# Patient Record
Sex: Female | Born: 1955
Health system: Southern US, Community
[De-identification: ages and names within clinical notes are randomized; demographics above are authoritative.]

## PROBLEM LIST (undated history)

## (undated) DIAGNOSIS — F329 Major depressive disorder, single episode, unspecified: Secondary | ICD-10-CM

## (undated) DIAGNOSIS — M545 Low back pain, unspecified: Secondary | ICD-10-CM

## (undated) DIAGNOSIS — I7090 Unspecified atherosclerosis: Secondary | ICD-10-CM

## (undated) DIAGNOSIS — K219 Gastro-esophageal reflux disease without esophagitis: Secondary | ICD-10-CM

## (undated) DIAGNOSIS — E785 Hyperlipidemia, unspecified: Secondary | ICD-10-CM

## (undated) DIAGNOSIS — G43909 Migraine, unspecified, not intractable, without status migrainosus: Secondary | ICD-10-CM

## (undated) DIAGNOSIS — M1990 Unspecified osteoarthritis, unspecified site: Secondary | ICD-10-CM

## (undated) DIAGNOSIS — G8929 Other chronic pain: Secondary | ICD-10-CM

## (undated) DIAGNOSIS — F32A Depression, unspecified: Secondary | ICD-10-CM

## (undated) DIAGNOSIS — G43009 Migraine without aura, not intractable, without status migrainosus: Secondary | ICD-10-CM

## (undated) HISTORY — DX: Migraine, unspecified, not intractable, without status migrainosus: G43.909

## (undated) HISTORY — DX: Hyperlipidemia, unspecified: E78.5

## (undated) HISTORY — DX: Other chronic pain: G89.29

## (undated) HISTORY — PX: ABDOMINAL HYSTERECTOMY: SHX81

## (undated) HISTORY — DX: Migraine without aura, not intractable, without status migrainosus: G43.009

## (undated) HISTORY — DX: Unspecified atherosclerosis: I70.90

## (undated) HISTORY — PX: TUBAL LIGATION: SHX77

## (undated) HISTORY — DX: Major depressive disorder, single episode, unspecified: F32.9

## (undated) HISTORY — DX: Low back pain: M54.5

## (undated) HISTORY — PX: OTHER SURGICAL HISTORY: SHX169

## (undated) HISTORY — PX: HEMORRHOID SURGERY: SHX153

## (undated) HISTORY — PX: NECK SURGERY: SHX720

## (undated) HISTORY — DX: Low back pain, unspecified: M54.50

## (undated) HISTORY — PX: REFRACTIVE SURGERY: SHX103

## (undated) HISTORY — DX: Depression, unspecified: F32.A

---

## 1991-03-07 HISTORY — PX: OTHER SURGICAL HISTORY: SHX169

## 1997-07-16 ENCOUNTER — Ambulatory Visit (HOSPITAL_COMMUNITY): Admission: RE | Admit: 1997-07-16 | Discharge: 1997-07-16 | Payer: Self-pay | Admitting: *Deleted

## 1998-07-12 ENCOUNTER — Encounter: Payer: Self-pay | Admitting: *Deleted

## 1998-07-12 ENCOUNTER — Ambulatory Visit (HOSPITAL_COMMUNITY): Admission: RE | Admit: 1998-07-12 | Discharge: 1998-07-12 | Payer: Self-pay | Admitting: *Deleted

## 2001-04-29 ENCOUNTER — Ambulatory Visit (HOSPITAL_COMMUNITY): Admission: RE | Admit: 2001-04-29 | Discharge: 2001-04-29 | Payer: Self-pay | Admitting: *Deleted

## 2001-04-29 ENCOUNTER — Encounter: Payer: Self-pay | Admitting: *Deleted

## 2001-10-04 ENCOUNTER — Encounter: Payer: Self-pay | Admitting: Otolaryngology

## 2001-10-04 ENCOUNTER — Ambulatory Visit (HOSPITAL_COMMUNITY): Admission: RE | Admit: 2001-10-04 | Discharge: 2001-10-04 | Payer: Self-pay | Admitting: Otolaryngology

## 2003-12-14 ENCOUNTER — Ambulatory Visit (HOSPITAL_COMMUNITY): Admission: RE | Admit: 2003-12-14 | Discharge: 2003-12-14 | Payer: Self-pay | Admitting: Neurology

## 2005-01-24 ENCOUNTER — Ambulatory Visit: Payer: Self-pay | Admitting: Cardiology

## 2010-03-21 ENCOUNTER — Emergency Department (HOSPITAL_COMMUNITY)
Admission: EM | Admit: 2010-03-21 | Discharge: 2010-03-21 | Payer: Self-pay | Source: Home / Self Care | Admitting: Emergency Medicine

## 2010-07-22 NOTE — Procedures (Signed)
VISUAL EVOKED POTENTIAL STUDY   PROCEDURE NUMBER:  [id number]   CLINICAL HISTORY:  The patient is a 55 year old with dizziness and migraine  headaches.  She was tested with a 12 x 16 check pattern while wearing  glasses.  She complained of neck pain during the test and was quite tense.   PROCEDURE:  The study was carried out using a 12 x 16 grid pattern  oscillating at 1.9 times per second.  Filters ranged between 1 and 100 Hz.  A hundred stimuli were averaged in duplicate to provide the final response.  All latencies were expressed in milliseconds.   DESCRIPTION OF FINDINGS:  Stimulation of the left eye produced well-defined  waveforms with excellent interline correlation.  Latencies were as follows:  N1: 73.28, P1: 100.63, N2: 132.87.   Similarly, stimulation of the right eye produced well-defined waveforms with  excellent interline correlation.  Latencies were as follows:  N1: 77.18,  P1:  100.63, N2: 131.90.   IMPRESSION:  These pattern reversal visual evoked responses are within  normal limits and show no evidence of conduction abnormality in the anterior  visual pathways bilaterally.      JXB:JYNW  D:  12/15/2003 12:03:22  T:  12/15/2003 14:38:38  Job #:  [id number]

## 2011-06-19 ENCOUNTER — Ambulatory Visit (INDEPENDENT_AMBULATORY_CARE_PROVIDER_SITE_OTHER): Payer: Medicare Other | Admitting: Family Medicine

## 2011-06-19 ENCOUNTER — Encounter: Payer: Self-pay | Admitting: Family Medicine

## 2011-06-19 ENCOUNTER — Other Ambulatory Visit: Payer: Self-pay | Admitting: Family Medicine

## 2011-06-19 VITALS — BP 104/74 | HR 91 | Resp 16 | Ht 65.75 in | Wt 163.4 lb

## 2011-06-19 DIAGNOSIS — F329 Major depressive disorder, single episode, unspecified: Secondary | ICD-10-CM

## 2011-06-19 DIAGNOSIS — K5900 Constipation, unspecified: Secondary | ICD-10-CM

## 2011-06-19 DIAGNOSIS — E785 Hyperlipidemia, unspecified: Secondary | ICD-10-CM | POA: Insufficient documentation

## 2011-06-19 DIAGNOSIS — G4700 Insomnia, unspecified: Secondary | ICD-10-CM

## 2011-06-19 DIAGNOSIS — F5104 Psychophysiologic insomnia: Secondary | ICD-10-CM

## 2011-06-19 DIAGNOSIS — M7918 Myalgia, other site: Secondary | ICD-10-CM

## 2011-06-19 DIAGNOSIS — F3289 Other specified depressive episodes: Secondary | ICD-10-CM

## 2011-06-19 DIAGNOSIS — G43909 Migraine, unspecified, not intractable, without status migrainosus: Secondary | ICD-10-CM

## 2011-06-19 DIAGNOSIS — IMO0001 Reserved for inherently not codable concepts without codable children: Secondary | ICD-10-CM

## 2011-06-19 DIAGNOSIS — K5909 Other constipation: Secondary | ICD-10-CM

## 2011-06-19 DIAGNOSIS — M791 Myalgia, unspecified site: Secondary | ICD-10-CM

## 2011-06-19 DIAGNOSIS — Z13228 Encounter for screening for other metabolic disorders: Secondary | ICD-10-CM

## 2011-06-19 DIAGNOSIS — F32A Depression, unspecified: Secondary | ICD-10-CM | POA: Insufficient documentation

## 2011-06-19 DIAGNOSIS — Z1321 Encounter for screening for nutritional disorder: Secondary | ICD-10-CM

## 2011-06-19 DIAGNOSIS — Z130 Encounter for screening for diseases of the blood and blood-forming organs and certain disorders involving the immune mechanism: Secondary | ICD-10-CM

## 2011-06-19 DIAGNOSIS — Z79899 Other long term (current) drug therapy: Secondary | ICD-10-CM | POA: Insufficient documentation

## 2011-06-19 MED ORDER — PROMETHAZINE HCL 25 MG PO TABS: 25.0000 mg | ORAL_TABLET | Freq: Four times a day (QID) | ORAL | Status: DC | PRN

## 2011-06-19 MED ORDER — LUBIPROSTONE 24 MCG PO CAPS: 24.0000 ug | ORAL_CAPSULE | Freq: Two times a day (BID) | ORAL | Status: AC

## 2011-06-19 NOTE — Patient Instructions (Addendum)
I will get your medical records Continue your current meds Get your labs done we will call with results F/U 2 months

## 2011-06-20 DIAGNOSIS — M7918 Myalgia, other site: Secondary | ICD-10-CM | POA: Insufficient documentation

## 2011-06-20 DIAGNOSIS — K5909 Other constipation: Secondary | ICD-10-CM | POA: Insufficient documentation

## 2011-06-20 DIAGNOSIS — F5104 Psychophysiologic insomnia: Secondary | ICD-10-CM | POA: Insufficient documentation

## 2011-06-20 LAB — COMPREHENSIVE METABOLIC PANEL
ALT: 11 U/L (ref 0–35)
Albumin: 4.5 g/dL (ref 3.5–5.2)
CO2: 24 mEq/L (ref 19–32)
Calcium: 9.8 mg/dL (ref 8.4–10.5)
Chloride: 107 mEq/L (ref 96–112)
Creat: 0.85 mg/dL (ref 0.50–1.10)
Potassium: 4.1 mEq/L (ref 3.5–5.3)

## 2011-06-20 LAB — CBC WITH DIFFERENTIAL/PLATELET
Hemoglobin: 12.3 g/dL (ref 12.0–15.0)
Lymphocytes Relative: 25 % (ref 12–46)
Lymphs Abs: 1.1 10*3/uL (ref 0.7–4.0)
MCH: 28.5 pg (ref 26.0–34.0)
Monocytes Relative: 6 % (ref 3–12)
Neutro Abs: 3 10*3/uL (ref 1.7–7.7)
Neutrophils Relative %: 64 % (ref 43–77)
Platelets: 216 10*3/uL (ref 150–400)
RBC: 4.32 MIL/uL (ref 3.87–5.11)
WBC: 4.6 10*3/uL (ref 4.0–10.5)

## 2011-06-20 LAB — LIPID PANEL
Cholesterol: 176 mg/dL (ref 0–200)
Total CHOL/HDL Ratio: 4.4 Ratio

## 2011-06-20 LAB — CK TOTAL AND CKMB (NOT AT ARMC)
CK, MB: 1 ng/mL (ref 0.3–4.0)
Total CK: 53 U/L (ref 7–177)

## 2011-06-20 NOTE — Assessment & Plan Note (Signed)
Due for his metabolic panel as well as complete CBC

## 2011-06-20 NOTE — Assessment & Plan Note (Signed)
Check fasting lipid panel has history of very elevated triglycerides

## 2011-06-20 NOTE — Assessment & Plan Note (Signed)
Given trial of Amitiza

## 2011-06-20 NOTE — Progress Notes (Signed)
  Subjective:    Patient ID: Savannah Rocha, female    DOB: [birth year], 56 y.o.   MRN: [id number]  HPI  Patient here to establish care. Previous primary care provider Dr. Sherryll Burger in [facility] Neurologist Dr. Anne Hahn GYN Dr. Mora Appl  Medications and history reviewed She is overdue for labs including lipid panel  Migraine- chronic history of migraine headaches. She's followed by neurology Dr. Anne Hahn. She's currently on Topamax and uses Imitrex as needed she also uses Phenergan and hydrocodone for severe migraines.  Constipation- history of chronic constipation she currently has these medications to have a bowel movement every time. She uses Dulcolax over-the-counter. She had a colonoscopy on June 2006 at that time had internal hemorrhoids otherwise normal. She was to have a repeat exam in 2011 however has not had this. She occasionally uses her daughters medication to have BM but does not know the name. Miralax/PEG has not helped  Chronic insomnia- uses imipramine but this does not help a lot  Paresthesia in feet/hands- has tried neurontin which has not helped, has history of neck surgery for benign tumor which has left her weak on the right side   Depression- on Zoloft and Xanax, which she does not use on regular basis, chronic meds  Review of Systems - per above  GEN- + fatigue, fever, weight loss,weakness, recent illness HEENT- denies eye drainage, change in vision, nasal discharge, CVS- denies chest pain, palpitations RESP- denies SOB, cough, wheeze ABD- denies N/V, change in stools, abd pain GU- denies dysuria, hematuria, dribbling, incontinence MSK- + joint pain, +muscle aches, injury Neuro- +headache, dizziness, syncope, seizure activity       Objective:   Physical Exam GEN- NAD, alert and oriented x3 HEENT- PERRL, EOMI, non injected sclera, pink conjunctiva, MMM, oropharynx clear Neck- Supple, no thyromegaly CVS- RRR, no murmur RESP-CTAB ABD-NABS,soft,  NT,ND EXT- No edema Pulses- Radial, DP- 2+ Feet- no open lesions, decreased sensation on soles of feet Psych- not depressed appearing or anxious, normal speech and affect      Assessment & Plan:

## 2011-06-20 NOTE — Assessment & Plan Note (Signed)
Will obtain records from neurology. Patient to continue current medication

## 2011-06-20 NOTE — Assessment & Plan Note (Signed)
She gets muscle aches and tingling in lower ext, will check LFT and CK as on statin therapy

## 2011-06-20 NOTE — Assessment & Plan Note (Signed)
Continue Zoloft and Xanax

## 2011-07-03 ENCOUNTER — Telehealth: Payer: Self-pay | Admitting: Family Medicine

## 2011-07-03 MED ORDER — ALPRAZOLAM 0.5 MG PO TABS: 0.5000 mg | ORAL_TABLET | Freq: Two times a day (BID) | ORAL | Status: DC

## 2011-07-03 NOTE — Telephone Encounter (Signed)
Will check into prior auth for amitiza and do you want to refill xanax?

## 2011-07-03 NOTE — Telephone Encounter (Signed)
Please get the prior authorization for the Amitiza. Xanax refilled

## 2011-07-07 NOTE — Telephone Encounter (Signed)
Completed.

## 2011-07-10 ENCOUNTER — Telehealth: Payer: Self-pay | Admitting: Family Medicine

## 2011-07-10 NOTE — Telephone Encounter (Signed)
Left message that med was approved

## 2011-07-25 ENCOUNTER — Ambulatory Visit (INDEPENDENT_AMBULATORY_CARE_PROVIDER_SITE_OTHER): Payer: Medicare Other | Admitting: Family Medicine

## 2011-07-25 ENCOUNTER — Encounter: Payer: Self-pay | Admitting: Family Medicine

## 2011-07-25 VITALS — BP 92/60 | HR 86 | Resp 18 | Ht 65.75 in | Wt 165.0 lb

## 2011-07-25 DIAGNOSIS — G43909 Migraine, unspecified, not intractable, without status migrainosus: Secondary | ICD-10-CM

## 2011-07-25 DIAGNOSIS — F32A Depression, unspecified: Secondary | ICD-10-CM

## 2011-07-25 DIAGNOSIS — E785 Hyperlipidemia, unspecified: Secondary | ICD-10-CM

## 2011-07-25 DIAGNOSIS — K5909 Other constipation: Secondary | ICD-10-CM

## 2011-07-25 DIAGNOSIS — F329 Major depressive disorder, single episode, unspecified: Secondary | ICD-10-CM

## 2011-07-25 DIAGNOSIS — Z79899 Other long term (current) drug therapy: Secondary | ICD-10-CM

## 2011-07-25 DIAGNOSIS — IMO0001 Reserved for inherently not codable concepts without codable children: Secondary | ICD-10-CM

## 2011-07-25 DIAGNOSIS — K5900 Constipation, unspecified: Secondary | ICD-10-CM

## 2011-07-25 DIAGNOSIS — M797 Fibromyalgia: Secondary | ICD-10-CM | POA: Insufficient documentation

## 2011-07-25 DIAGNOSIS — F3289 Other specified depressive episodes: Secondary | ICD-10-CM

## 2011-07-25 MED ORDER — DULOXETINE HCL 20 MG PO CPEP: 20.0000 mg | ORAL_CAPSULE | Freq: Every day | ORAL | Status: DC

## 2011-07-25 MED ORDER — LUBIPROSTONE 24 MCG PO CAPS: 24.0000 ug | ORAL_CAPSULE | Freq: Two times a day (BID) | ORAL | Status: AC

## 2011-07-25 NOTE — Assessment & Plan Note (Signed)
Lipids at goal, 4 week holiday to see if this helps muscle aches

## 2011-07-25 NOTE — Progress Notes (Signed)
  Subjective:    Patient ID: Savannah Rocha, female    DOB: October 24, 1955, 56 y.o.   MRN: [id number]  HPI Pt presents with muscle aches all over in back arms and legs, she also has had some pain in her nuckles and hands, and had swelling in her hands and feet last week, now resolved. She feels very fatigued,her mother in law died 2 weeks ago and this has been very hard on the family.She thinks her mood is okay and she would like to come off some meds, she stopped her imipramine. Medications reviewed  Review of Systems GEN- + fatigue, fever, weight loss,weakness, recent illness HEENT- denies eye drainage, change in vision, nasal discharge, CVS- denies chest pain, palpitations RESP- denies SOB, cough, wheeze ABD- denies N/V, change in stools, abd pain GU- denies dysuria, hematuria, dribbling, incontinence MSK- + joint pain, +muscle aches, injury Neuro- + headache, dizziness, syncope, seizure activity        Objective:   Physical Exam GEN- NAD, alert and oriented x3 HEENT- PERRL, EOMI, non injected sclera, pink conjunctiva, MMM, oropharynx clear Neck- Supple, no thryomegaly CVS- RRR, no murmur RESP-CTAB EXT- No edema Pulses- Radial, DP- 2+ Psych- normal speech and affect MSK- no joint swelling, TTP over trigger points in back and legs      Assessment & Plan:

## 2011-07-25 NOTE — Assessment & Plan Note (Signed)
Improved with Amitiza

## 2011-07-25 NOTE — Assessment & Plan Note (Signed)
Doing well despite recent death in family, continue xanax, plan to titrate off zoloft and start cymbalta

## 2011-07-25 NOTE — Assessment & Plan Note (Signed)
Unchanged, continue current meds 

## 2011-07-25 NOTE — Assessment & Plan Note (Signed)
Her symptoms are more consistent with fibromyalgia, she has had negative bloodwork and conduction studies in the past for her neuropathy symptoms as well. Will try to put her on Cymbalta to cover both her chronic depression and pain.  I will give her 4 weeks off her statin to see if this helps

## 2011-07-25 NOTE — Patient Instructions (Signed)
For your zoloft, take 1/2 tablet a day for the next week. Then start the cymbalta once a day Do not take the pravastatin for 4 weeks - to see if this helps your muscle aches Get the thyroid levels drawn Try V8 juice/ or Gaterade for the cramps F/U 2 months

## 2011-07-26 ENCOUNTER — Other Ambulatory Visit: Payer: Self-pay | Admitting: Family Medicine

## 2011-07-26 LAB — TSH: TSH: 0.949 u[IU]/mL (ref 0.350–4.500)

## 2011-08-01 ENCOUNTER — Telehealth: Payer: Self-pay | Admitting: Family Medicine

## 2011-08-01 MED ORDER — MILNACIPRAN HCL 25 MG PO TABS: ORAL_TABLET | ORAL | Status: DC

## 2011-08-01 NOTE — Telephone Encounter (Signed)
Her insurance not to Cymbalta clean. It will however takes the [facility]  which is also used for depression and fibromyalgia. Will start her on 12.5 mg daily for 1 day, then she will increase to 12.5 mg twice a day for 2 days, then increase to 25mg  BID for 3 days, then increase to 50mg  BID.  25 mg tablets sent over

## 2011-08-02 ENCOUNTER — Telehealth: Payer: Self-pay | Admitting: Family Medicine

## 2011-08-02 NOTE — Telephone Encounter (Signed)
I spoke with pt, she has stopped her SSRI she wants to hold on trying anything new at this time. S

## 2011-08-02 NOTE — Telephone Encounter (Signed)
I called pt again and LVM, please let her know, her insurance would not cover Cymbalta.Instead they will cover a medication called Savella which is used for both fibromyalgia and Depression. Side effects mostly are headache and nausea if they happen. Below is how she is suppose to take the medication

## 2011-08-08 NOTE — Telephone Encounter (Signed)
Doesn't want to take any meds that could potentially cause headaches because she already has bad migraines

## 2011-08-14 ENCOUNTER — Ambulatory Visit: Payer: PRIVATE HEALTH INSURANCE | Admitting: Family Medicine

## 2011-08-24 ENCOUNTER — Encounter (HOSPITAL_COMMUNITY): Payer: Self-pay | Admitting: Emergency Medicine

## 2011-08-24 ENCOUNTER — Emergency Department (HOSPITAL_COMMUNITY)
Admission: EM | Admit: 2011-08-24 | Discharge: 2011-08-25 | Disposition: A | Discharge status: Home / Self Care | Payer: Medicare Other | Attending: Emergency Medicine | Admitting: Emergency Medicine

## 2011-08-24 DIAGNOSIS — G43909 Migraine, unspecified, not intractable, without status migrainosus: Secondary | ICD-10-CM | POA: Insufficient documentation

## 2011-08-24 DIAGNOSIS — M545 Low back pain, unspecified: Secondary | ICD-10-CM | POA: Insufficient documentation

## 2011-08-24 DIAGNOSIS — Z836 Family history of other diseases of the respiratory system: Secondary | ICD-10-CM | POA: Insufficient documentation

## 2011-08-24 DIAGNOSIS — Z90710 Acquired absence of both cervix and uterus: Secondary | ICD-10-CM | POA: Insufficient documentation

## 2011-08-24 DIAGNOSIS — Z801 Family history of malignant neoplasm of trachea, bronchus and lung: Secondary | ICD-10-CM | POA: Insufficient documentation

## 2011-08-24 DIAGNOSIS — F3289 Other specified depressive episodes: Secondary | ICD-10-CM | POA: Insufficient documentation

## 2011-08-24 DIAGNOSIS — G8929 Other chronic pain: Secondary | ICD-10-CM | POA: Insufficient documentation

## 2011-08-24 DIAGNOSIS — E785 Hyperlipidemia, unspecified: Secondary | ICD-10-CM | POA: Insufficient documentation

## 2011-08-24 DIAGNOSIS — F329 Major depressive disorder, single episode, unspecified: Secondary | ICD-10-CM | POA: Insufficient documentation

## 2011-08-24 DIAGNOSIS — Z7982 Long term (current) use of aspirin: Secondary | ICD-10-CM | POA: Insufficient documentation

## 2011-08-24 DIAGNOSIS — M549 Dorsalgia, unspecified: Secondary | ICD-10-CM

## 2011-08-24 DIAGNOSIS — Z8249 Family history of ischemic heart disease and other diseases of the circulatory system: Secondary | ICD-10-CM | POA: Insufficient documentation

## 2011-08-24 LAB — URINALYSIS, ROUTINE W REFLEX MICROSCOPIC
Bilirubin Urine: NEGATIVE
Hgb urine dipstick: NEGATIVE
Protein, ur: NEGATIVE mg/dL
Urobilinogen, UA: 0.2 mg/dL (ref 0.0–1.0)

## 2011-08-24 NOTE — ED Notes (Signed)
C/o lower back pain onset yesterday after mowing lawn with riding mower; states then went shopping and pain started shortly thereafter. Reports hx of chronic back pain with surgical repair of ruptured discs in lower back.  States has been taking flexeril and hydrocodone with little relief.  Pt is in obvious pain, tearful, and states that sitting upright is the most comfortable for her.

## 2011-08-24 NOTE — ED Provider Notes (Signed)
History  Scribed for [employer]. Colon Branch, MD, the patient was seen in room APA08/APA08. This chart was scribed by Candelaria Stagers. The patient's care started at 11:35 PM   CSN: [id number]  Arrival date & time 08/24/11  2235   First MD Initiated Contact with Patient 08/24/11 2324      Chief Complaint  Patient presents with  . Back Pain    The history is provided by the patient.   Savannah Rocha is a 56 y.o. female who presents to the Emergency Department complaining of lower back pain that she reports started yesterday after mowing her lawn.  She states that the pain has gone up into upper back.  She states that the pain hurts worse when standing up.  She is having difficulty walking.  She has used a walker today with some relief.  She has taken a muscle relaxer, hydrocodone, ice, heating pad, and analgesic creams.  She has h/o back surgeries.    PCP Dr. Jeanice Lim Past Medical History  Diagnosis Date  . Migraines   . Chronic lower back pain   . Depression   . Hyperlipidemia     Past Surgical History  Procedure Date  . Lower back    . Abdominal hysterectomy   . Hemorrhoid surgery   . Right knee 1993  . Neck surgery     benign tumor removed     Family History  Problem Relation Age of Onset  . Cancer Father     lung  . Depression Sister   . Hypertension Sister   . Hyperlipidemia Sister   . Depression Sister   . Heart disease Brother   . COPD Brother     History  Substance Use Topics  . Smoking status: Never Smoker   . Smokeless tobacco: Not on file  . Alcohol Use: No    OB History    Grav Para Term Preterm Abortions TAB SAB Ect Mult Living                  Review of Systems  Constitutional: Negative for fever.       Per HPI, otherwise negative  HENT: Negative for congestion.        Per HPI, otherwise negative  Eyes: Negative.  Negative for discharge and redness.  Respiratory: Negative for cough and shortness of breath.        Per HPI, otherwise negative    Cardiovascular: Negative for chest pain.       Per HPI, otherwise negative  Gastrointestinal: Negative for vomiting and abdominal pain.  Genitourinary: Negative.   Musculoskeletal: Negative for back pain.       Per HPI, otherwise negative  Skin: Negative.  Negative for rash.  Neurological: Negative for syncope, numbness and headaches.  Psychiatric/Behavioral:       No behavior change.    Allergies  Review of patient's allergies indicates no known allergies.  Home Medications   Current Outpatient Rx  Name Route Sig Dispense Refill  . ALPRAZOLAM 0.5 MG PO TABS Oral Take 1 tablet (0.5 mg total) by mouth 2 (two) times daily. 60 tablet 2  . ASPIRIN 325 MG PO TABS Oral Take 325 mg by mouth daily.    . CYCLOBENZAPRINE HCL 10 MG PO TABS Oral Take 10 mg by mouth 3 (three) times daily as needed.    Marland Kitchen GEMFIBROZIL 600 MG PO TABS Oral Take 600 mg by mouth 2 (two) times daily before a meal.    . HYDROCODONE-ACETAMINOPHEN 5-500  MG PO TABS Oral Take 1 tablet by mouth every 6 (six) hours as needed.    . LUBIPROSTONE 24 MCG PO CAPS Oral Take 1 capsule (24 mcg total) by mouth 2 (two) times daily with a meal. 60 capsule 3  . MILNACIPRAN HCL 25 MG PO TABS  Take 50mg  by mouth twice a day 120 tablet 3  . OMEPRAZOLE 20 MG PO CPDR Oral Take 20 mg by mouth daily.    . SERTRALINE HCL 100 MG PO TABS Oral Take 100 mg by mouth daily.    Marland Kitchen SIMVASTATIN 40 MG PO TABS Oral Take 40 mg by mouth every evening.    . SUMATRIPTAN SUCCINATE 100 MG PO TABS  TAKE 1 TABLET BY MOUTH AS NEEDED 20 tablet 2    Generic [id number]   100MG    N O T I C E   PRESCR ...  . TOPIRAMATE 100 MG PO TABS Oral Take 100 mg by mouth daily.      BP 109/74  Pulse 105  Temp 97.5 F (36.4 C) (Oral)  Resp 16  Ht 5\' 5"  (1.651 m)  Wt 160 lb (72.576 kg)  BMI 26.63 kg/m2  SpO2 99%  Physical Exam  Nursing note and vitals reviewed. Constitutional: She is oriented to person, place, and time. She appears well-developed and well-nourished. No  distress.       Awake, alert, nontoxic appearance with baseline speech.  HENT:  Head: Normocephalic and atraumatic.  Eyes: EOM are normal. Pupils are equal, round, and reactive to light. Right eye exhibits no discharge. Left eye exhibits no discharge.  Neck: Neck supple. No tracheal deviation present.  Cardiovascular: Normal rate and regular rhythm.   No murmur heard. Pulmonary/Chest: Effort normal and breath sounds normal. No respiratory distress. She has no wheezes. She has no rales. She exhibits no tenderness.  Abdominal: Soft. Bowel sounds are normal. She exhibits no distension and no mass. There is no tenderness. There is no rebound.  Musculoskeletal: Normal range of motion. She exhibits no edema.       Thoracic back: She exhibits no tenderness.       Lumbar back: She exhibits no tenderness.       Tenderness from the bilaterally paraspinal muscles from L2 down to the sacrum on both sides.  Spine is normal  Neurological: She is alert and oriented to person, place, and time. No sensory deficit.       Mental status baseline for patient.  Upper extremity motor strength and sensation intact and symmetric bilaterally.  Skin: Skin is warm and dry. No rash noted.  Psychiatric: She has a normal mood and affect. Her behavior is normal.    ED Course  Procedures   DIAGNOSTIC STUDIES: Oxygen Saturation is 99% on room air, normal by my interpretation.    COORDINATION OF CARE:   Results for orders placed during the hospital encounter of 08/24/11  URINALYSIS, ROUTINE W REFLEX MICROSCOPIC      Component Value Range   Color, Urine STRAW (*) YELLOW   APPearance CLEAR  CLEAR   Specific Gravity, Urine <1.005 (*) 1.005 - 1.030   pH 6.0  5.0 - 8.0   Glucose, UA NEGATIVE  NEGATIVE mg/dL   Hgb urine dipstick NEGATIVE  NEGATIVE   Bilirubin Urine NEGATIVE  NEGATIVE   Ketones, ur NEGATIVE  NEGATIVE mg/dL   Protein, ur NEGATIVE  NEGATIVE mg/dL   Urobilinogen, UA 0.2  0.0 - 1.0 mg/dL   Nitrite  NEGATIVE  NEGATIVE   Leukocytes,  UA NEGATIVE  NEGATIVE     Dg Lumbar Spine Complete  08/25/2011  *RADIOLOGY REPORT*  Clinical Data: Low back pain.  LUMBAR SPINE - COMPLETE 4+ VIEW  Comparison: None.  Findings: No evidence of acute fracture, spondylolysis, or spondylolisthesis.  Old lumbar spine fusions with pedicle screws and posterior fixation rods are seen from levels of L4-S1. No evidence of hardware failure or loosening.  Mild degenerative disc disease seen at L2-3.  No other significant bone abnormality identified.  Atherosclerotic calcification of the abdominal aorta noted.  IMPRESSION:  1.  No acute findings. 2.  Mild L2-3 degenerative disc disease. 3.  Old lumbar spine fusion from L4-S1.  Original Report Authenticated By: Danae Orleans, M.D.    MDM  Patient with a history of back surgeries who mowed the lawn  yesterday and as a result  developed lower back pain.given IV fluids, analgesics x2 , antiemetic, antiinflammatory with improvement in pain. Patient able to ambulate to the bathroom unassisted. Xrays do not show acute injury. Hardware from previous surgeries is intact. Dx testing d/w pt and family.  Questions answered.  Verb understanding, agreeable to d/c home with outpt f/u. Pt feels improved after observation and/or treatment in ED.Pt stable in ED with no significant deterioration in condition.The patient appears reasonably screened and/or stabilized for discharge and I doubt any other medical condition or other [facility] requiring further screening, evaluation, or treatment in the ED at this time prior to discharge.  I personally performed the services described in this documentation, which was scribed in my presence. The recorded information has been reviewed and considered.   MDM Reviewed: nursing note and vitals Interpretation: x-ray and labs           [facility]. Colon Branch, MD 08/25/11 [phone]

## 2011-08-24 NOTE — ED Notes (Signed)
Pt presents with back pain x2 days. States it has progressively gotten worse over the last 2 days. States she had 2 back surgerys in the past back in the 90s. Pt appears visibly in pain, Denies any specific injury to aggravate the issue. Pt is AAx4. States her pain 10/10

## 2011-08-25 ENCOUNTER — Emergency Department (HOSPITAL_COMMUNITY): Payer: Medicare Other

## 2011-08-25 MED ORDER — OXYCODONE-ACETAMINOPHEN 5-325 MG PO TABS: 1.0000 | ORAL_TABLET | ORAL | Status: DC | PRN

## 2011-08-25 MED ORDER — HYDROMORPHONE HCL PF 1 MG/ML IJ SOLN
1.0000 mg | Freq: Once | INTRAMUSCULAR | Status: AC
  Administered 2011-08-25: 1 mg via INTRAVENOUS
  Filled 2011-08-25: qty 1

## 2011-08-25 MED ORDER — ONDANSETRON HCL 4 MG/2ML IJ SOLN
4.0000 mg | Freq: Once | INTRAMUSCULAR | Status: AC
  Administered 2011-08-25: 4 mg via INTRAVENOUS
  Filled 2011-08-25: qty 2

## 2011-08-25 MED ORDER — ONDANSETRON HCL 4 MG PO TABS: 4.0000 mg | ORAL_TABLET | Freq: Four times a day (QID) | ORAL | Status: DC

## 2011-08-25 MED ORDER — KETOROLAC TROMETHAMINE 30 MG/ML IJ SOLN
30.0000 mg | Freq: Once | INTRAMUSCULAR | Status: AC
  Administered 2011-08-25: 30 mg via INTRAVENOUS
  Filled 2011-08-25: qty 1

## 2011-08-25 NOTE — ED Notes (Signed)
Returns from radiology; in no distress; pt is calm and appears comfortable.  States she feels better.

## 2011-08-25 NOTE — Discharge Instructions (Signed)
Your x-rays show that all of the hardware from your previous surgeries is intact. There was no new injury. Continue the comfort measures you have been using with heat, ice, rest. Use the stronger pain medicine as needed. Use the nausea medicine if needed. Followup with your doctor.    Back Pain, Adult Back pain is very common. The pain often gets better over time. The cause of back pain is usually not dangerous. Most people can learn to manage their back pain on their own.  HOME CARE   Stay active. Start with short walks on flat ground if you can. Try to walk farther each day.   Do not sit, drive, or stand in one place for more than 30 minutes. Do not stay in bed.   Do not avoid exercise or work. Activity can help your back heal faster.   Be careful when you bend or lift an object. Bend at your knees, keep the object close to you, and do not twist.   Sleep on a firm mattress. Lie on your side, and bend your knees. If you lie on your back, put a pillow under your knees.   Only take medicines as told by your doctor.   Put ice on the injured area.   Put ice in a plastic bag.   Place a towel between your skin and the bag.   Leave the ice on for 15 to 20 minutes, 3 to 4 times a day for the first 2 to 3 days. After that, you can switch between ice and heat packs.   Ask your doctor about back exercises or massage.   Avoid feeling anxious or stressed. Find good ways to deal with stress, such as exercise.  GET HELP RIGHT AWAY IF:   Your pain does not go away with rest or medicine.   Your pain does not go away in 1 week.   You have new problems.   You do not feel well.   The pain spreads into your legs.   You cannot control when you poop (bowel movement) or pee (urinate).   Your arms or legs feel weak or lose feeling (numbness).   You feel sick to your stomach (nauseous) or throw up (vomit).   You have belly (abdominal) pain.   You feel like you may pass out (faint).  MAKE  SURE YOU:   Understand these instructions.   Will watch your condition.   Will get help right away if you are not doing well or get worse.  Document Released: 08/09/2007 Document Revised: 02/09/2011 Document Reviewed: 07/11/2010 [facility] Patient Information 2012 [place], [place].

## 2011-08-25 NOTE — ED Notes (Signed)
Left in c/o family for transport home; in no distress; arouses easily to verbal stimuli; respirations even, non-labored; left in c/o family for transport home; instructions reviewed and f/u information provided; family and pt verbalize understanding. Escorted to waiting room via wc.

## 2011-08-26 ENCOUNTER — Emergency Department (HOSPITAL_COMMUNITY)
Admission: EM | Admit: 2011-08-26 | Discharge: 2011-08-26 | Disposition: A | Discharge status: Home / Self Care | Payer: Medicare Other | Attending: Emergency Medicine | Admitting: Emergency Medicine

## 2011-08-26 ENCOUNTER — Encounter (HOSPITAL_COMMUNITY): Payer: Self-pay | Admitting: Physical Medicine and Rehabilitation

## 2011-08-26 DIAGNOSIS — Z79899 Other long term (current) drug therapy: Secondary | ICD-10-CM | POA: Insufficient documentation

## 2011-08-26 DIAGNOSIS — M5380 Other specified dorsopathies, site unspecified: Secondary | ICD-10-CM | POA: Insufficient documentation

## 2011-08-26 DIAGNOSIS — G43909 Migraine, unspecified, not intractable, without status migrainosus: Secondary | ICD-10-CM | POA: Insufficient documentation

## 2011-08-26 DIAGNOSIS — M549 Dorsalgia, unspecified: Secondary | ICD-10-CM

## 2011-08-26 DIAGNOSIS — E785 Hyperlipidemia, unspecified: Secondary | ICD-10-CM | POA: Insufficient documentation

## 2011-08-26 DIAGNOSIS — M545 Low back pain, unspecified: Secondary | ICD-10-CM | POA: Insufficient documentation

## 2011-08-26 DIAGNOSIS — Z7982 Long term (current) use of aspirin: Secondary | ICD-10-CM | POA: Insufficient documentation

## 2011-08-26 DIAGNOSIS — M62838 Other muscle spasm: Secondary | ICD-10-CM

## 2011-08-26 MED ORDER — DIAZEPAM 5 MG PO TABS
10.0000 mg | ORAL_TABLET | Freq: Once | ORAL | Status: AC
  Administered 2011-08-26: 10 mg via ORAL
  Filled 2011-08-26: qty 2

## 2011-08-26 MED ORDER — DIAZEPAM 5 MG PO TABS: 5.0000 mg | ORAL_TABLET | Freq: Four times a day (QID) | ORAL | Status: AC | PRN

## 2011-08-26 MED ORDER — DIAZEPAM 5 MG/ML IJ SOLN: 10.0000 mg | Freq: Once | INTRAMUSCULAR | Status: DC

## 2011-08-26 NOTE — ED Notes (Signed)
Pt presents to department for evaluation of lower back pain. States she was mowing yard on Thursday, pain started later that evening. Pt is worried that she pulled something. States history of surgery to back. 8/10 pain upon arrival. Pt states pain increases with movement, having trouble walking and is unable to lay flat. Also states that both of her legs feel heavy. She is alert and oriented x4.

## 2011-08-26 NOTE — ED Notes (Addendum)
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 oxycodone that was given to her in the other hosp made her sick and was unable to take it. Pt has hx of back surgery with plate in place.

## 2011-08-26 NOTE — ED Provider Notes (Signed)
History     CSN: [id number]  Arrival date & time 08/26/11  1839   First MD Initiated Contact with Patient 08/26/11 2113      Chief Complaint  Patient presents with  . Back Pain   HPI  History provided by the patient. Patient is a 56 year old female with history of lumbar back surgery, hyperlipidemia, depression and migraines presents with complaints of low back pain and "muscle spasms". Patient states that she's been having increased and sharp severe low back pains for the past 3 days. Patient states that symptoms began with a general ache to her low back after sitting on the riding lawn mower to mow the lawn on Thursday. Patient states that the next morning she had episodes of severe pain that would limit her movements and stop her in her "tracks". Patient states she has been trying to lay around to rest and has used some over-the-counter pain medications without significant relief. Patient was seen for these complaints yesterday at any pain hospital and had x-rays performed. Patient was given additional prescriptions for medications to help with symptoms but states this has not been helping completely. She denies any other change in symptoms. Patient denies any radiation of pain. She denies any numbness or weakness to lower extremities. She denies any urinary or fecal incontinence, urinary retention or perineal numbness.     Past Medical History  Diagnosis Date  . Migraines   . Chronic lower back pain   . Depression   . Hyperlipidemia     Past Surgical History  Procedure Date  . Lower back    . Abdominal hysterectomy   . Hemorrhoid surgery   . Right knee 1993  . Neck surgery     benign tumor removed     Family History  Problem Relation Age of Onset  . Cancer Father     lung  . Depression Sister   . Hypertension Sister   . Hyperlipidemia Sister   . Depression Sister   . Heart disease Brother   . COPD Brother     History  Substance Use Topics  . Smoking status:  Never Smoker   . Smokeless tobacco: Not on file  . Alcohol Use: No    OB History    Grav Para Term Preterm Abortions TAB SAB Ect Mult Living                  Review of Systems  Constitutional: Negative for fever and chills.  HENT: Negative for neck pain.   Gastrointestinal: Negative for nausea and vomiting.  Genitourinary: Negative for dysuria, frequency, hematuria, flank pain, vaginal bleeding and vaginal discharge.  Musculoskeletal: Positive for back pain.  Skin: Negative for rash.    Allergies  Review of patient's allergies indicates no known allergies.  Home Medications   Current Outpatient Rx  Name Route Sig Dispense Refill  . ALPRAZOLAM 0.5 MG PO TABS Oral Take 0.5 mg by mouth 2 (two) times daily.    . ASPIRIN 325 MG PO TABS Oral Take 325 mg by mouth daily.    Marland Kitchen CALCIUM CARBONATE-VITAMIN D 500-200 MG-UNIT PO TABS Oral Take 1 tablet by mouth 2 (two) times daily.    Marland Kitchen VITAMIN B 12 PO Oral Take 1 tablet by mouth daily.    . CYCLOBENZAPRINE HCL 10 MG PO TABS Oral Take 10 mg by mouth 3 (three) times daily as needed. For muscle relaxant    . GEMFIBROZIL 600 MG PO TABS Oral Take 600 mg by mouth  2 (two) times daily before a meal.    . HYDROCODONE-ACETAMINOPHEN 5-500 MG PO TABS Oral Take 1 tablet by mouth daily as needed. For pain    . OMEPRAZOLE 20 MG PO CPDR Oral Take 20 mg by mouth daily.    Marland Kitchen PROMETHAZINE HCL 25 MG PO TABS Oral Take 25 mg by mouth every 6 (six) hours as needed. For nausea/vomiting    . SIMVASTATIN 40 MG PO TABS Oral Take 40 mg by mouth every morning.     . SUMATRIPTAN SUCCINATE 100 MG PO TABS Oral Take 100 mg by mouth daily as needed. For headache    . TOPIRAMATE 100 MG PO TABS Oral Take 100 mg by mouth daily.      BP 92/67  Pulse 92  Temp 97.8 F (36.6 C) (Oral)  Resp 17  Ht 5\' 5"  (1.651 m)  Wt 162 lb (73.483 kg)  BMI 26.96 kg/m2  SpO2 99%  Physical Exam  Nursing note and vitals reviewed. Constitutional: She is oriented to person, place, and  time. She appears well-developed and well-nourished. No distress.  HENT:  Head: Normocephalic.  Cardiovascular: Normal rate and regular rhythm.   Pulmonary/Chest: Effort normal and breath sounds normal.  Abdominal: Soft. There is no tenderness. There is no rebound and no guarding.  Musculoskeletal:       Cervical back: Normal.       Thoracic back: Normal.       Lumbar back: She exhibits tenderness.       Back:       Old midline surgical scar consistent with history of prior surgery.  Neurological: She is alert and oriented to person, place, and time. She has normal strength. No sensory deficit. Gait normal.  Skin: Skin is warm and dry. No rash noted.  Psychiatric: She has a normal mood and affect. Her behavior is normal.    ED Course  Procedures   Dg Lumbar Spine Complete  08/25/2011  *RADIOLOGY REPORT*  Clinical Data: Low back pain.  LUMBAR SPINE - COMPLETE 4+ VIEW  Comparison: None.  Findings: No evidence of acute fracture, spondylolysis, or spondylolisthesis.  Old lumbar spine fusions with pedicle screws and posterior fixation rods are seen from levels of L4-S1. No evidence of hardware failure or loosening.  Mild degenerative disc disease seen at L2-3.  No other significant bone abnormality identified.  Atherosclerotic calcification of the abdominal aorta noted.  IMPRESSION:  1.  No acute findings. 2.  Mild L2-3 degenerative disc disease. 3.  Old lumbar spine fusion from L4-S1.  Original Report Authenticated By: Danae Orleans, M.D.     1. Back pain   2. Muscle spasm       MDM  Patient seen and evaluated. Patient in no acute distress.          Angus Seller, [place]onna 08/27/11 [phone]

## 2011-08-26 NOTE — Discharge Instructions (Signed)
You were seen and evaluated today for your complaints of back pain. At this time your providers feel he may return home and continue to rest and to your back pain symptoms with medications. Please followup with your primary care provider or back specialist if your pain persists for one week or more. You should also seek followup or return to emergency room if you develop any worsening symptoms, increased pain, loss of control of her bowels or bladder, weakness or numbness in her lower legs.    Back Pain, Adult Low back pain is very common. About 1 in 5 people have back pain.The cause of low back pain is rarely dangerous. The pain often gets better over time.About half of people with a sudden onset of back pain feel better in just 2 weeks. About 8 in 10 people feel better by 6 weeks.  CAUSES Some common causes of back pain include:  Strain of the muscles or ligaments supporting the spine.   Wear and tear (degeneration) of the spinal discs.   Arthritis.   Direct injury to the back.  DIAGNOSIS Most of the time, the direct cause of low back pain is not known.However, back pain can be treated effectively even when the exact cause of the pain is unknown.Answering your caregiver's questions about your overall health and symptoms is one of the most accurate ways to make sure the cause of your pain is not dangerous. If your caregiver needs more information, he or she may order lab work or imaging tests (X-rays or MRIs).However, even if imaging tests show changes in your back, this usually does not require surgery. HOME CARE INSTRUCTIONS For many people, back pain returns.Since low back pain is rarely dangerous, it is often a condition that people can learn to [facility] their own.   Remain active. It is stressful on the back to sit or stand in one place. Do not sit, drive, or stand in one place for more than 30 minutes at a time. Take short walks on level surfaces as soon as pain allows.Try to  increase the length of time you walk each day.   Do not stay in bed.Resting more than 1 or 2 days can delay your recovery.   Do not avoid exercise or work.Your body is made to move.It is not dangerous to be active, even though your back may hurt.Your back will likely heal faster if you return to being active before your pain is gone.   Pay attention to your body when you bend and lift. Many people have less discomfortwhen lifting if they bend their knees, keep the load close to their bodies,and avoid twisting. Often, the most comfortable positions are those that put less stress on your recovering back.   Find a comfortable position to sleep. Use a firm mattress and lie on your side with your knees slightly bent. If you lie on your back, put a pillow under your knees.   Only take over-the-counter or prescription medicines as directed by your caregiver. Over-the-counter medicines to reduce pain and inflammation are often the most helpful.Your caregiver may prescribe muscle relaxant drugs.These medicines help dull your pain so you can more quickly return to your normal activities and healthy exercise.   Put ice on the injured area.   Put ice in a plastic bag.   Place a towel between your skin and the bag.   Leave the ice on for 15 to 20 minutes, 3 to 4 times a day for the first 2 to  3 days. After that, ice and heat may be alternated to reduce pain and spasms.   Ask your caregiver about trying back exercises and gentle massage. This may be of some benefit.   Avoid feeling anxious or stressed.Stress increases muscle tension and can worsen back pain.It is important to recognize when you are anxious or stressed and learn ways to manage it.Exercise is a great option.  SEEK MEDICAL CARE IF:  You have pain that is not relieved with rest or medicine.   You have pain that does not improve in 1 week.   You have new symptoms.   You are generally not feeling well.  SEEK IMMEDIATE MEDICAL  CARE IF:   You have pain that radiates from your back into your legs.   You develop new bowel or bladder control problems.   You have unusual weakness or numbness in your arms or legs.   You develop nausea or vomiting.   You develop abdominal pain.   You feel faint.  Document Released: 02/20/2005 Document Revised: 02/09/2011 Document Reviewed: 07/11/2010 [facility] Patient Information 2012 [place], [place].     Back Exercises Back exercises help treat and prevent back injuries. The goal of back exercises is to increase the strength of your abdominal and back muscles and the flexibility of your back. These exercises should be started when you no longer have back pain. Back exercises include:  Pelvic Tilt. Lie on your back with your knees bent. Tilt your pelvis until the lower part of your back is against the floor. Hold this position 5 to 10 sec and repeat 5 to 10 times.   Knee to Chest. Pull first 1 knee up against your chest and hold for 20 to 30 seconds, repeat this with the other knee, and then both knees. This may be done with the other leg straight or bent, whichever feels better.   Sit-Ups or Curl-Ups. Bend your knees 90 degrees. Start with tilting your pelvis, and do a partial, slow sit-up, lifting your trunk only 30 to 45 degrees off the floor. Take at least 2 to 3 seconds for each sit-up. Do not do sit-ups with your knees out straight. If partial sit-ups are difficult, simply do the above but with only tightening your abdominal muscles and holding it as directed.   Hip-Lift. Lie on your back with your knees flexed 90 degrees. Push down with your feet and shoulders as you raise your hips a couple inches off the floor; hold for 10 seconds, repeat 5 to 10 times.   Back arches. Lie on your stomach, propping yourself up on bent elbows. Slowly press on your hands, causing an arch in your low back. Repeat 3 to 5 times. Any initial stiffness and discomfort should lessen with repetition  over time.   Shoulder-Lifts. Lie face down with arms beside your body. Keep hips and torso pressed to floor as you slowly lift your head and shoulders off the floor.  Do not overdo your exercises, especially in the beginning. Exercises may cause you some mild back discomfort which lasts for a few minutes; however, if the pain is more severe, or lasts for more than 15 minutes, do not continue exercises until you see your caregiver. Improvement with exercise therapy for back problems is slow.  See your caregivers for assistance with developing a proper back exercise program. Document Released: 03/30/2004 Document Revised: 02/09/2011 Document Reviewed: 02/20/2005 [facility] Patient Information 2012 [place], [place].     RESOURCE GUIDE  Chronic Pain Problems: Contact  [facility]  [phone] Patients need to be referred by their primary care doctor.  Insufficient Money for Medicine: Contact United Way:  call "211" or [facility] [phone].  No Primary Care Doctor: - Call [facility] Connect  [phone] - can help you locate a primary care doctor that  accepts your insurance, provides certain services, etc. - Physician Referral Service- [phone]  Agencies that provide inexpensive medical care: - [facility]  [phone] - [facility]  [phone] - [facility]  [phone] - Women's Clinic  [phone] - [facility]  [phone] [facility]  [phone]  Medicaid-accepting [facility] Providers: - [facility]- [address]  [phone], Mon-Fri 9am-7pm, Sat 9am-1pm - [facility]- [address] [place], Suite [place]  [phone] - [facility]- [address] [place]  [phone] [facility]- [address]  [phone] - Renaye Rakers- [address] [place], [address], [phone]  Only accepts [place]  patients after they have their name  applied to their card  Self Pay (no insurance) in [place]: - Sickle Cell Patients: Dr Willey Blade, [facility]  [address] [place], [phone] - [facility]- [address] [place]  [phone]       [facility] [place]- [address]       -     [facility]- see information above (Speak to [employer] if you do not have insurance)       -  [facility]- [address] [place], [phone]       -  [facility]- [address],  [phone]       -  [facility]- [address], [phone]       -  Dr Julio Sicks-  [address], [place], [phone]       -  [facility]- [address], [phone]       -  [facility]- [address] [phone], also [address] [phone]       -    [facility]- [address] [place], [phone], 1st & 3rd Saturday   every month, 10am-1pm  1) Find a Doctor and Pay Out of Pocket Although you won't have to find out who is covered by your insurance plan, it is a good idea to ask around and get recommendations. You will then need to call the office and see if the doctor you have chosen will accept you as a new patient and what types of options they offer for patients who are self-pay. Some doctors offer discounts or will set up payment plans for their patients who do not have insurance, but you will need to ask so you aren't surprised when you get to your appointment.  2) Contact Your Local Health Department Not all health departments have doctors that can see patients for sick visits, but many do, so it is worth a call to see if yours does. If you don't know where your local health department is, you can check in your phone book. The CDC also has a tool to help you locate your state's health department, and many state websites also have listings of all of their local health  departments.  3) Find a Walk-in Clinic If your illness is not likely to be very severe or complicated, you may want to try a walk in clinic. These are popping up all over the country in pharmacies, drugstores, and shopping centers. They're usually staffed by nurse practitioners or physician assistants that have been trained to treat common illnesses and complaints. They're usually fairly quick and inexpensive. However, if you have serious medical issues or chronic medical problems, these are probably not your best option  STD Testing - [facility] Department of [facility], STD Clinic, [address] [place], phone [phone] or [phone].  Monday - Friday, call for an appointment. [facility] Department of [employer], STD Clinic, [place] [address], [place], phone [phone] or [phone].  Monday - Friday, call for an appointment.  Abuse/Neglect: [facility] Child Abuse Hotline (709) 756-0106 [facility] Child Abuse Hotline 540-019-3455 (After Hours)  Emergency Shelter:  [facility] 712-532-9798  Maternity Homes: - [facility] [place] [facility] 506-420-6864 - [facility] [phone]  MRSA Hotline #:   703-588-7457  [facility] Resources  [facility] [place]  United Way [facility] Dept. [address]                 [address]         [address] [place] [address]  Blondell Reveal Phone:  [phone]                                  Phone:  [phone]                   Phone:  [phone]  [facility], [phone] - [phone]       -     [facility] in [place], [address],                                  [phone], [facility] Child Abuse  Hotline 531-098-8392 or 803-700-0842 (After Hours)   Behavioral Health Services  Substance Abuse Resources: - [facility]  857-484-7617 - [facility] [phone]) [phone] - [facility] [place] [phone] Floydene Flock [phone] - Residential & Outpatient Substance Abuse Program  [phone]  Psychological Services: [facility]  ([phone] Services  [phone] - [facility], [phone] [place]. [address] [place], ACCESS LINE: 718-847-7295 or 863-585-4400, [employer]  Dental Assistance  If unable to pay or uninsured, contact:  [facility] or [facility]. to become qualified for the adult dental clinic.  Patients with Medicaid: [facility] [phone] [address], [phone] [address] [phone]  If unable to pay, or uninsured, contact [facility] 562-016-8151) or  [facility] Department [phone] in [place], [phone] in [place]) to become qualified for the adult dental clinic  Other Low-Cost Community Dental Services: - [facility]- [address] [place], [place], [place], [address], [phone], [address], 2nd and 4th Thursday of the month at 6:30am.  10 clients each day by appointment, can sometimes see walk-in patients if someone does not show for an appointment. [facility]- [address] [place], [place], [address], [phone] - [facility]- [address] [place], [place], [address], [phone] - [place] [facility]- [phone] [facility] Health Department- [phone] [facility] Department- [phone]

## 2011-08-27 NOTE — ED Provider Notes (Signed)
Medical screening examination/treatment/procedure(s) were performed by non-physician practitioner and as supervising physician I was immediately available for consultation/collaboration.  Hurman Horn, MD 08/27/11 1251

## 2011-09-26 ENCOUNTER — Ambulatory Visit: Payer: Medicare Other | Admitting: Family Medicine

## 2011-09-27 ENCOUNTER — Other Ambulatory Visit: Payer: Self-pay | Admitting: Family Medicine

## 2011-11-16 ENCOUNTER — Ambulatory Visit (INDEPENDENT_AMBULATORY_CARE_PROVIDER_SITE_OTHER): Payer: Medicare Other | Admitting: Family Medicine

## 2011-11-16 ENCOUNTER — Encounter: Payer: Self-pay | Admitting: Family Medicine

## 2011-11-16 VITALS — BP 104/62 | HR 95 | Resp 18 | Ht 65.75 in | Wt 158.1 lb

## 2011-11-16 DIAGNOSIS — L237 Allergic contact dermatitis due to plants, except food: Secondary | ICD-10-CM

## 2011-11-16 DIAGNOSIS — F3289 Other specified depressive episodes: Secondary | ICD-10-CM

## 2011-11-16 DIAGNOSIS — E669 Obesity, unspecified: Secondary | ICD-10-CM

## 2011-11-16 DIAGNOSIS — K5900 Constipation, unspecified: Secondary | ICD-10-CM

## 2011-11-16 DIAGNOSIS — F329 Major depressive disorder, single episode, unspecified: Secondary | ICD-10-CM

## 2011-11-16 DIAGNOSIS — F32A Depression, unspecified: Secondary | ICD-10-CM

## 2011-11-16 DIAGNOSIS — G4700 Insomnia, unspecified: Secondary | ICD-10-CM

## 2011-11-16 DIAGNOSIS — L255 Unspecified contact dermatitis due to plants, except food: Secondary | ICD-10-CM

## 2011-11-16 DIAGNOSIS — K5909 Other constipation: Secondary | ICD-10-CM

## 2011-11-16 MED ORDER — PREDNISONE 10 MG PO TABS: ORAL_TABLET | ORAL | Status: DC

## 2011-11-16 MED ORDER — ZOLPIDEM TARTRATE 5 MG PO TABS: 5.0000 mg | ORAL_TABLET | Freq: Every evening | ORAL | Status: DC | PRN

## 2011-11-16 MED ORDER — METHYLPREDNISOLONE ACETATE 40 MG/ML IJ SUSP
40.0000 mg | Freq: Once | INTRAMUSCULAR | Status: AC
  Administered 2011-11-16: 40 mg via INTRAMUSCULAR

## 2011-11-16 NOTE — Patient Instructions (Addendum)
Continue current medications  Start oral prednisone tomorrow Trial of ambien for sleep  F/U 4 months

## 2011-11-19 ENCOUNTER — Encounter: Payer: Self-pay | Admitting: Family Medicine

## 2011-11-19 DIAGNOSIS — E669 Obesity, unspecified: Secondary | ICD-10-CM | POA: Insufficient documentation

## 2011-11-19 DIAGNOSIS — G4700 Insomnia, unspecified: Secondary | ICD-10-CM | POA: Insufficient documentation

## 2011-11-19 DIAGNOSIS — L237 Allergic contact dermatitis due to plants, except food: Secondary | ICD-10-CM | POA: Insufficient documentation

## 2011-11-19 NOTE — Assessment & Plan Note (Signed)
She used lactulose from her family member which helped, also a form of PEG, advised to continue PEG, she will call with name of the kind her daughter had

## 2011-11-19 NOTE — Progress Notes (Signed)
  Subjective:    Patient ID: [place] W Balderson, female    DOB: April 12, 1955, 56 y.o.   MRN: [id number]  HPI  Pt presents with poison ivy exposure on hands and feett  1 week ago, tried OTC meds continues to have itching.   Difficulty sleeping, able to fall asleep but sleeps only a few hours, her nerves have been bad and she restarted her zoloft, having difficulty with her grand-daughter who is a teen. She is using her xanax but this does not make her sleepy   Review of Systems - per above  GEN- denies fatigue, fever, weight loss,weakness, recent illness HEENT- denies eye drainage, change in vision, nasal discharge, CVS- denies chest pain, palpitations RESP- denies SOB, cough, wheeze ABD- denies N/V, change in stools, abd pain GU- denies dysuria, hematuria, dribbling, incontinence MSK- denies joint pain,+ muscle aches, injury Neuro- denies headache, dizziness, syncope, seizure activity      Objective:   Physical Exam GEN- NAD, alert and oriented x3 HEENT- PERRL, EOMI, non injected sclera, pink conjunctiva, MMM, oropharynx clear Neck- Supple, noLAD CVS- RRR, no murmur RESP-CTAB EXT- No edema Pulses- Radial, DP- 2+ Psych-normal affect and Mood Skin- mild erythema, slightly raised on fingers and right hand, no lesions on palms, no lesion on soles, erythematous macular rash on left foot.         Assessment & Plan:

## 2011-11-19 NOTE — Assessment & Plan Note (Signed)
Deterioated, agree with restarting zoloft, continue xanax

## 2011-11-19 NOTE — Assessment & Plan Note (Signed)
Trial of ambien 

## 2011-11-19 NOTE — Assessment & Plan Note (Addendum)
Given IM steroids, low dose oral and not very severe rash

## 2012-01-01 ENCOUNTER — Encounter (HOSPITAL_COMMUNITY): Payer: Self-pay

## 2012-01-09 ENCOUNTER — Inpatient Hospital Stay (HOSPITAL_COMMUNITY): Admission: RE | Admit: 2012-01-09 | Payer: PRIVATE HEALTH INSURANCE | Source: Ambulatory Visit

## 2012-01-10 ENCOUNTER — Other Ambulatory Visit: Payer: Self-pay

## 2012-01-10 MED ORDER — GEMFIBROZIL 600 MG PO TABS: 600.0000 mg | ORAL_TABLET | Freq: Two times a day (BID) | ORAL | Status: DC

## 2012-01-10 NOTE — Patient Instructions (Addendum)
Your procedure is scheduled on:  01/16/12  Report to [facility] at 06:30 AM.  Call this number if you have problems the morning of surgery: [phone]   Remember:   Do not eat or drink:After Midnight.  Take these medicines the morning of surgery with A SIP OF WATER: Omeprazole and Topamax. Hydrocodone and Xanax only if needed.   Do not wear jewelry, make-up or nail polish.  Do not wear lotions, powders, or perfumes. You may wear deodorant.  Do not shave 48 hours prior to surgery. Men may shave face and neck.  Do not bring valuables to the hospital.  Contacts, dentures or bridgework may not be worn into surgery.  Leave suitcase in the car. After surgery it may be brought to your room.  For patients admitted to the hospital, checkout time is 11:00 AM the day of discharge.   Patients discharged the day of surgery will not be allowed to drive home.    Special Instructions: Start using your eye drops before surgery as directed by your eye doctor.   Please read over the following fact sheets that you were given: Anesthesia Post-op Instructions    Cataract Surgery  A cataract is a clouding of the lens of the eye. When a lens becomes cloudy, vision is reduced based on the degree and nature of the clouding. Surgery may be needed to improve vision. Surgery removes the cloudy lens and usually replaces it with a substitute lens (intraocular lens, IOL). LET YOUR EYE DOCTOR KNOW ABOUT:  Allergies to food or medicine.  Medicines taken including herbs, eyedrops, over-the-counter medicines, and creams.  Use of steroids (by mouth or creams).  Previous problems with anesthetics or numbing medicine.  History of bleeding problems or blood clots.  Previous surgery.  Other health problems, including diabetes and kidney problems.  Possibility of pregnancy, if this applies. RISKS AND COMPLICATIONS  Infection.  Inflammation of the eyeball (endophthalmitis) that can spread to both eyes  (sympathetic ophthalmia).  Poor wound healing.  If an IOL is inserted, it can later fall out of proper position. This is very uncommon.  Clouding of the part of your eye that holds an IOL in place. This is called an "after-cataract." These are uncommon, but easily treated. BEFORE THE PROCEDURE  Do not eat or drink anything except small amounts of water for 8 to 12 before your surgery, or as directed by your caregiver.  Unless you are told otherwise, continue any eyedrops you have been prescribed.  Talk to your primary caregiver about all other medicines that you take (both prescription and non-prescription). In some cases, you may need to stop or change medicines near the time of your surgery. This is most important if you are taking blood-thinning medicine.Do not stop medicines unless you are told to do so.  Arrange for someone to drive you to and from the procedure.  Do not put contact lenses in either eye on the day of your surgery. PROCEDURE There is more than one method for safely removing a cataract. Your doctor can explain the differences and help determine which is best for you. Phacoemulsification surgery is the most common form of cataract surgery.  An injection is given behind the eye or eyedrops are given to make this a painless procedure.  A small cut (incision) is made on the edge of the clear, dome-shaped surface that covers the front of the eye (cornea).  A tiny probe is painlessly inserted into the eye. This device gives off ultrasound  waves that soften and break up the cloudy center of the lens. This makes it easier for the cloudy lens to be removed by suction.  An IOL may be implanted.  The normal lens of the eye is covered by a clear capsule. Part of that capsule is intentionally left in the eye to support the IOL.  Your surgeon may or may not use stitches to close the incision. There are other forms of cataract surgery that require a larger incision and stiches  to close the eye. This approach is taken in cases where the doctor feels that the cataract cannot be easily removed using phacoemulsification. AFTER THE PROCEDURE  When an IOL is implanted, it does not need care. It becomes a permanent part of your eye and cannot be seen or felt.  Your doctor will schedule follow-up exams to check on your progress.  Review your other medicines with your doctor to see which can be resumed after surgery.  Use eyedrops or take medicine as prescribed by your doctor. Document Released: 02/09/2011 Document Revised: 05/15/2011 Document Reviewed: 02/09/2011 [facility] Patient Information 2013 [place], [place].    PATIENT INSTRUCTIONS POST-ANESTHESIA  IMMEDIATELY FOLLOWING SURGERY:  Do not drive or operate machinery for the first twenty four hours after surgery.  Do not make any important decisions for twenty four hours after surgery or while taking narcotic pain medications or sedatives.  If you develop intractable nausea and vomiting or a severe headache please notify your doctor immediately.  FOLLOW-UP:  Please make an appointment with your surgeon as instructed. You do not need to follow up with anesthesia unless specifically instructed to do so.  WOUND CARE INSTRUCTIONS (if applicable):  Keep a dry clean dressing on the anesthesia/puncture wound site if there is drainage.  Once the wound has quit draining you may leave it open to air.  Generally you should leave the bandage intact for twenty four hours unless there is drainage.  If the epidural site drains for more than 36-48 hours please call the anesthesia department.  QUESTIONS?:  Please feel free to call your physician or the hospital operator if you have any questions, and they will be happy to assist you.

## 2012-01-11 ENCOUNTER — Encounter (HOSPITAL_COMMUNITY)
Admission: RE | Admit: 2012-01-11 | Discharge: 2012-01-11 | Disposition: A | Discharge status: Home / Self Care | Payer: Medicare Other | Source: Ambulatory Visit | Attending: Ophthalmology | Admitting: Ophthalmology

## 2012-01-11 ENCOUNTER — Encounter (HOSPITAL_COMMUNITY): Payer: Self-pay

## 2012-01-11 HISTORY — DX: Unspecified osteoarthritis, unspecified site: M19.90

## 2012-01-11 HISTORY — DX: Gastro-esophageal reflux disease without esophagitis: K21.9

## 2012-01-11 LAB — BASIC METABOLIC PANEL WITH GFR
BUN: 10 mg/dL (ref 6–23)
CO2: 23 meq/L (ref 19–32)
Calcium: 9.8 mg/dL (ref 8.4–10.5)
Chloride: 107 meq/L (ref 96–112)
Creatinine, Ser: 0.77 mg/dL (ref 0.50–1.10)
GFR calc Af Amer: >90 mL/min
GFR calc non Af Amer: >90 mL/min
Glucose, Bld: 108 mg/dL — ABNORMAL HIGH (ref 70–99)
Potassium: 4.1 meq/L (ref 3.5–5.1)
Sodium: 141 meq/L (ref 135–145)

## 2012-01-11 LAB — HEMOGLOBIN AND HEMATOCRIT, BLOOD
HCT: 35.7 % — ABNORMAL LOW (ref 36.0–46.0)
Hemoglobin: 12.2 g/dL (ref 12.0–15.0)

## 2012-01-15 MED ORDER — PHENYLEPHRINE HCL 2.5 % OP SOLN
OPHTHALMIC | Status: AC
  Filled 2012-01-15: qty 2

## 2012-01-15 MED ORDER — FLURBIPROFEN SODIUM 0.03 % OP SOLN
OPHTHALMIC | Status: AC
  Filled 2012-01-15: qty 2.5

## 2012-01-16 ENCOUNTER — Encounter (HOSPITAL_COMMUNITY): Payer: Self-pay | Admitting: Anesthesiology

## 2012-01-16 ENCOUNTER — Ambulatory Visit (HOSPITAL_COMMUNITY)
Admission: RE | Admit: 2012-01-16 | Discharge: 2012-01-16 | Disposition: A | Discharge status: Home / Self Care | Payer: Medicare Other | Source: Ambulatory Visit | Attending: Ophthalmology | Admitting: Ophthalmology

## 2012-01-16 ENCOUNTER — Encounter (HOSPITAL_COMMUNITY)
Admission: RE | Disposition: A | Discharge status: Home / Self Care | Payer: Self-pay | Source: Ambulatory Visit | Attending: Ophthalmology

## 2012-01-16 ENCOUNTER — Encounter (HOSPITAL_COMMUNITY): Payer: Self-pay | Admitting: *Deleted

## 2012-01-16 ENCOUNTER — Ambulatory Visit (HOSPITAL_COMMUNITY): Payer: Medicare Other | Admitting: Anesthesiology

## 2012-01-16 DIAGNOSIS — Z01812 Encounter for preprocedural laboratory examination: Secondary | ICD-10-CM | POA: Insufficient documentation

## 2012-01-16 DIAGNOSIS — H2510 Age-related nuclear cataract, unspecified eye: Secondary | ICD-10-CM | POA: Insufficient documentation

## 2012-01-16 HISTORY — PX: CATARACT EXTRACTION W/PHACO: SHX586

## 2012-01-16 SURGERY — PHACOEMULSIFICATION, CATARACT, WITH IOL INSERTION
Anesthesia: Monitor Anesthesia Care | Site: Eye | Laterality: Right | Wound class: Clean

## 2012-01-16 MED ORDER — CYCLOPENTOLATE-PHENYLEPHRINE 0.2-1 % OP SOLN
OPHTHALMIC | Status: AC
  Filled 2012-01-16: qty 2

## 2012-01-16 MED ORDER — EPINEPHRINE HCL 1 MG/ML IJ SOLN
INTRAOCULAR | Status: DC | PRN
  Administered 2012-01-16: 08:00:00

## 2012-01-16 MED ORDER — EPINEPHRINE HCL 1 MG/ML IJ SOLN
INTRAMUSCULAR | Status: AC
  Filled 2012-01-16: qty 1

## 2012-01-16 MED ORDER — PHENYLEPHRINE HCL 2.5 % OP SOLN
1.0000 [drp] | OPHTHALMIC | Status: AC
  Administered 2012-01-16 (×3): 1 [drp] via OPHTHALMIC

## 2012-01-16 MED ORDER — TETRACAINE HCL 0.5 % OP SOLN
OPHTHALMIC | Status: AC
  Filled 2012-01-16: qty 2

## 2012-01-16 MED ORDER — LACTATED RINGERS IV SOLN
INTRAVENOUS | Status: DC
  Administered 2012-01-16: 08:00:00 via INTRAVENOUS

## 2012-01-16 MED ORDER — LACTATED RINGERS IV SOLN
INTRAVENOUS | Status: DC | PRN
  Administered 2012-01-16: 08:00:00 via INTRAVENOUS

## 2012-01-16 MED ORDER — TETRACAINE HCL 0.5 % OP SOLN
1.0000 [drp] | OPHTHALMIC | Status: AC
  Administered 2012-01-16 (×3): 1 [drp] via OPHTHALMIC

## 2012-01-16 MED ORDER — MIDAZOLAM HCL 2 MG/2ML IJ SOLN
1.0000 mg | INTRAMUSCULAR | Status: DC | PRN
  Administered 2012-01-16: 2 mg via INTRAVENOUS

## 2012-01-16 MED ORDER — CYCLOPENTOLATE-PHENYLEPHRINE 0.2-1 % OP SOLN
1.0000 [drp] | OPHTHALMIC | Status: AC
  Administered 2012-01-16 (×3): 1 [drp] via OPHTHALMIC

## 2012-01-16 MED ORDER — MIDAZOLAM HCL 2 MG/2ML IJ SOLN
INTRAMUSCULAR | Status: AC
  Filled 2012-01-16: qty 2

## 2012-01-16 MED ORDER — BSS IO SOLN
INTRAOCULAR | Status: DC | PRN
  Administered 2012-01-16: 15 mL via INTRAOCULAR

## 2012-01-16 MED ORDER — FLURBIPROFEN SODIUM 0.03 % OP SOLN
1.0000 [drp] | OPHTHALMIC | Status: AC
  Administered 2012-01-16 (×3): 1 [drp] via OPHTHALMIC

## 2012-01-16 MED ORDER — PROVISC 10 MG/ML IO SOLN
INTRAOCULAR | Status: DC | PRN
  Administered 2012-01-16: 8.5 mg via INTRAOCULAR

## 2012-01-16 SURGICAL SUPPLY — 24 items
CAPSULAR TENSION RING-AMO (OPHTHALMIC RELATED) IMPLANT
CLOTH BEACON ORANGE TIMEOUT ST (SAFETY) ×1 IMPLANT
EYE SHIELD UNIVERSAL CLEAR (GAUZE/BANDAGES/DRESSINGS) ×1 IMPLANT
GLOVE BIO SURGEON STRL SZ 6.5 (GLOVE) ×1 IMPLANT
GLOVE ECLIPSE 6.5 STRL STRAW (GLOVE) IMPLANT
GLOVE ECLIPSE 7.0 STRL STRAW (GLOVE) IMPLANT
GLOVE EXAM NITRILE LRG STRL (GLOVE) IMPLANT
GLOVE EXAM NITRILE MD LF STRL (GLOVE) ×1 IMPLANT
GLOVE SKINSENSE NS SZ6.5 (GLOVE)
GLOVE SKINSENSE STRL SZ6.5 (GLOVE) IMPLANT
HEALON 5 0.6 ML (INTRAOCULAR LENS) IMPLANT
KIT VITRECTOMY (OPHTHALMIC RELATED) IMPLANT
LENS IOL ACRYSOF IQ TORIC 15.0 ×1 IMPLANT
PAD ARMBOARD 7.5X6 YLW CONV (MISCELLANEOUS) ×1 IMPLANT
PROC W NO LENS (INTRAOCULAR LENS)
PROC W SPEC LENS (INTRAOCULAR LENS) ×2
PROCESS W NO LENS (INTRAOCULAR LENS) IMPLANT
PROCESS W SPEC LENS (INTRAOCULAR LENS) IMPLANT
RING MALYGIN (MISCELLANEOUS) IMPLANT
TAPE SURG TRANSPORE 1 IN (GAUZE/BANDAGES/DRESSINGS) IMPLANT
TAPE SURGICAL TRANSPORE 1 IN (GAUZE/BANDAGES/DRESSINGS) ×1
VISCOELASTIC ADDITIONAL (OPHTHALMIC RELATED) IMPLANT
WATER STERILE IRR 1000ML POUR (IV SOLUTION) ×1 IMPLANT
WATER STERILE IRR 250ML POUR (IV SOLUTION) IMPLANT

## 2012-01-16 NOTE — Anesthesia Postprocedure Evaluation (Signed)
  Anesthesia Post-op Note  Patient: Savannah Rocha  Procedure(s) Performed: Procedure(s) (LRB) with comments: CATARACT EXTRACTION PHACO AND INTRAOCULAR LENS PLACEMENT (IOC) (Right) - CDE:6.42  Patient Location: Short Stay  Anesthesia Type:MAC  Level of Consciousness: awake, alert  and oriented  Airway and Oxygen Therapy: Patient Spontanous Breathing  Post-op Pain: none  Post-op Assessment: Post-op Vital signs reviewed, Patient's Cardiovascular Status Stable, Respiratory Function Stable, Patent Airway, No signs of Nausea or vomiting and Pain level controlled  Post-op Vital Signs: Reviewed and stable  Complications: No apparent anesthesia complications

## 2012-01-16 NOTE — Addendum Note (Signed)
Addendum  created 01/16/12 1339 by Yareliz Thorstenson S Mysti Haley, CRNA   Modules edited:Charges VN    

## 2012-01-16 NOTE — Transfer of Care (Signed)
Immediate Anesthesia Transfer of Care Note  Patient: Savannah Rocha  Procedure(s) Performed: Procedure(s) (LRB) with comments: CATARACT EXTRACTION PHACO AND INTRAOCULAR LENS PLACEMENT (IOC) (Right) - CDE:6.42  Patient Location: Short Stay  Anesthesia Type:MAC  Level of Consciousness: awake, alert  and oriented  Airway & Oxygen Therapy: Patient Spontanous Breathing  Post-op Assessment: Report given to PACU RN  Post vital signs: Reviewed and stable  Complications: No apparent anesthesia complications

## 2012-01-16 NOTE — H&P (Signed)
The patient was re examined and there is no change in the patients condition since the original H and P. 

## 2012-01-16 NOTE — Preoperative (Signed)
Beta Blockers   Reason not to administer Beta Blockers:Not Applicable 

## 2012-01-16 NOTE — Anesthesia Preprocedure Evaluation (Addendum)
Anesthesia Evaluation  Patient identified by MRN, date of birth, ID band Patient awake    Reviewed: Allergy & Precautions, H&P , NPO status , Patient's Chart, lab work & pertinent test results  Airway Mallampati: II      Dental  (+) Teeth Intact   Pulmonary neg pulmonary ROS,  breath sounds clear to auscultation        Cardiovascular negative cardio ROS  Rhythm:Regular Rate:Normal     Neuro/Psych PSYCHIATRIC DISORDERS Depression    GI/Hepatic GERD-  Medicated,  Endo/Other    Renal/GU      Musculoskeletal   Abdominal   Peds  Hematology   Anesthesia Other Findings   Reproductive/Obstetrics                           Anesthesia Physical Anesthesia Plan  ASA: II  Anesthesia Plan: MAC   Post-op Pain Management:    Induction: Intravenous  Airway Management Planned: Nasal Cannula  Additional Equipment:   Intra-op Plan:   Post-operative Plan:   Informed Consent: I have reviewed the patients History and Physical, chart, labs and discussed the procedure including the risks, benefits and alternatives for the proposed anesthesia with the patient or authorized representative who has indicated his/her understanding and acceptance.     Plan Discussed with:   Anesthesia Plan Comments:        Anesthesia Quick Evaluation

## 2012-01-16 NOTE — Op Note (Signed)
Patient brought to the operating room and prepped and draped in the usual manner.  Lid speculum inserted in right eye.  Stab incision made at the twelve o'clock position.  Provisc instilled in the anterior chamber.   A 2.4 mm. Stab incision was made temporally.  An anterior capsulotomy was done with a bent 25 gauge needle.  The nucleus was hydrodissected.  The Phaco tip was inserted in the anterior chamber and the nucleus was emulsified.  CDE was 6.42.  The cortical material was then removed with the I and A tip.  Posterior capsule was the polished.  The anterior chamber was deepened with Provisc.  A 16.0 Alcon [id number] Toric IOL was then inserted in the capsular bag at the 128 degree position.  Provisc was then removed with the I and A tip.  The wound was then hydrated.  Patient sent to the Recovery Room in good condition with follow up in my office.

## 2012-01-17 NOTE — Addendum Note (Signed)
Addendum  created 01/17/12 1102 by Franco Nones, CRNA   Modules edited:Anesthesia Events, Charting, Inpatient Notes

## 2012-01-18 ENCOUNTER — Encounter (HOSPITAL_COMMUNITY): Payer: Self-pay

## 2012-01-23 ENCOUNTER — Encounter (HOSPITAL_COMMUNITY): Payer: Self-pay

## 2012-01-23 ENCOUNTER — Encounter (HOSPITAL_COMMUNITY)
Admission: RE | Admit: 2012-01-23 | Discharge: 2012-01-23 | Disposition: A | Discharge status: Home / Self Care | Payer: Medicare Other | Source: Ambulatory Visit | Attending: Ophthalmology | Admitting: Ophthalmology

## 2012-01-23 MED ORDER — KETOROLAC TROMETHAMINE 0.5 % OP SOLN: 1.0000 [drp] | OPHTHALMIC | Status: DC

## 2012-01-23 MED ORDER — ONDANSETRON HCL 4 MG/2ML IJ SOLN: 4.0000 mg | Freq: Once | INTRAMUSCULAR | Status: AC | PRN

## 2012-01-23 MED ORDER — FENTANYL CITRATE 0.05 MG/ML IJ SOLN: 25.0000 ug | INTRAMUSCULAR | Status: DC | PRN

## 2012-01-23 MED ORDER — LIDOCAINE HCL 3.5 % OP GEL: 1.0000 "application " | Freq: Once | OPHTHALMIC | Status: DC

## 2012-01-29 MED ORDER — PHENYLEPHRINE HCL 2.5 % OP SOLN
OPHTHALMIC | Status: AC
  Filled 2012-01-29: qty 2

## 2012-01-29 MED ORDER — TETRACAINE HCL 0.5 % OP SOLN
OPHTHALMIC | Status: AC
  Filled 2012-01-29: qty 2

## 2012-01-29 MED ORDER — CYCLOPENTOLATE-PHENYLEPHRINE 0.2-1 % OP SOLN
OPHTHALMIC | Status: AC
  Filled 2012-01-29: qty 2

## 2012-01-29 MED ORDER — FLURBIPROFEN SODIUM 0.03 % OP SOLN
OPHTHALMIC | Status: AC
  Filled 2012-01-29: qty 2.5

## 2012-01-30 ENCOUNTER — Encounter (HOSPITAL_COMMUNITY): Payer: Self-pay | Admitting: *Deleted

## 2012-01-30 ENCOUNTER — Encounter (HOSPITAL_COMMUNITY): Payer: Self-pay | Admitting: Anesthesiology

## 2012-01-30 ENCOUNTER — Ambulatory Visit (HOSPITAL_COMMUNITY): Payer: Medicare Other | Admitting: Anesthesiology

## 2012-01-30 ENCOUNTER — Encounter (HOSPITAL_COMMUNITY)
Admission: RE | Disposition: A | Discharge status: Home / Self Care | Payer: Self-pay | Source: Ambulatory Visit | Attending: Ophthalmology

## 2012-01-30 ENCOUNTER — Ambulatory Visit (HOSPITAL_COMMUNITY)
Admission: RE | Admit: 2012-01-30 | Discharge: 2012-01-30 | Disposition: A | Discharge status: Home / Self Care | Payer: Medicare Other | Source: Ambulatory Visit | Attending: Ophthalmology | Admitting: Ophthalmology

## 2012-01-30 DIAGNOSIS — H2510 Age-related nuclear cataract, unspecified eye: Secondary | ICD-10-CM | POA: Insufficient documentation

## 2012-01-30 HISTORY — PX: CATARACT EXTRACTION W/PHACO: SHX586

## 2012-01-30 SURGERY — PHACOEMULSIFICATION, CATARACT, WITH IOL INSERTION
Anesthesia: Monitor Anesthesia Care | Site: Eye | Laterality: Left | Wound class: Clean

## 2012-01-30 MED ORDER — MIDAZOLAM HCL 2 MG/2ML IJ SOLN
INTRAMUSCULAR | Status: AC
  Filled 2012-01-30: qty 2

## 2012-01-30 MED ORDER — TETRACAINE HCL 0.5 % OP SOLN
1.0000 [drp] | OPHTHALMIC | Status: AC
  Administered 2012-01-30 (×3): 1 [drp] via OPHTHALMIC

## 2012-01-30 MED ORDER — FLURBIPROFEN SODIUM 0.03 % OP SOLN
1.0000 [drp] | OPHTHALMIC | Status: AC
  Administered 2012-01-30 (×3): 1 [drp] via OPHTHALMIC

## 2012-01-30 MED ORDER — EPINEPHRINE HCL 1 MG/ML IJ SOLN
INTRAOCULAR | Status: DC | PRN
  Administered 2012-01-30: 08:00:00

## 2012-01-30 MED ORDER — CYCLOPENTOLATE-PHENYLEPHRINE 0.2-1 % OP SOLN
1.0000 [drp] | OPHTHALMIC | Status: AC
  Administered 2012-01-30 (×3): 1 [drp] via OPHTHALMIC

## 2012-01-30 MED ORDER — BSS IO SOLN
INTRAOCULAR | Status: DC | PRN
  Administered 2012-01-30: 15 mL via INTRAOCULAR

## 2012-01-30 MED ORDER — PHENYLEPHRINE HCL 2.5 % OP SOLN
1.0000 [drp] | OPHTHALMIC | Status: AC
  Administered 2012-01-30 (×3): 1 [drp] via OPHTHALMIC

## 2012-01-30 MED ORDER — LACTATED RINGERS IV SOLN
INTRAVENOUS | Status: DC
  Administered 2012-01-30: 1000 mL via INTRAVENOUS

## 2012-01-30 MED ORDER — MIDAZOLAM HCL 2 MG/2ML IJ SOLN
1.0000 mg | INTRAMUSCULAR | Status: DC | PRN
  Administered 2012-01-30: 2 mg via INTRAVENOUS

## 2012-01-30 MED ORDER — PROVISC 10 MG/ML IO SOLN
INTRAOCULAR | Status: DC | PRN
  Administered 2012-01-30: 8.5 mg via INTRAOCULAR

## 2012-01-30 SURGICAL SUPPLY — 22 items
CAPSULAR TENSION RING-AMO (OPHTHALMIC RELATED) IMPLANT
CLOTH BEACON ORANGE TIMEOUT ST (SAFETY) ×1 IMPLANT
EYE SHIELD UNIVERSAL CLEAR (GAUZE/BANDAGES/DRESSINGS) ×1 IMPLANT
GLOVE BIO SURGEON STRL SZ 6.5 (GLOVE) ×1 IMPLANT
GLOVE ECLIPSE 6.5 STRL STRAW (GLOVE) IMPLANT
GLOVE ECLIPSE 7.0 STRL STRAW (GLOVE) IMPLANT
GLOVE EXAM NITRILE LRG STRL (GLOVE) IMPLANT
GLOVE EXAM NITRILE MD LF STRL (GLOVE) ×1 IMPLANT
GLOVE SKINSENSE NS SZ6.5 (GLOVE)
GLOVE SKINSENSE STRL SZ6.5 (GLOVE) IMPLANT
HEALON 5 0.6 ML (INTRAOCULAR LENS) IMPLANT
KIT VITRECTOMY (OPHTHALMIC RELATED) IMPLANT
LENS IOL ACRYSOF IQ TORIC 15.0 ×1 IMPLANT
PAD ARMBOARD 7.5X6 YLW CONV (MISCELLANEOUS) ×1 IMPLANT
PROC W NO LENS (INTRAOCULAR LENS)
PROC W SPEC LENS (INTRAOCULAR LENS) ×2
PROCESS W NO LENS (INTRAOCULAR LENS) IMPLANT
PROCESS W SPEC LENS (INTRAOCULAR LENS) IMPLANT
RING MALYGIN (MISCELLANEOUS) IMPLANT
TAPE PAPER 2X10 WHT MICROPORE (GAUZE/BANDAGES/DRESSINGS) ×1 IMPLANT
VISCOELASTIC ADDITIONAL (OPHTHALMIC RELATED) IMPLANT
WATER STERILE IRR 250ML POUR (IV SOLUTION) ×1 IMPLANT

## 2012-01-30 NOTE — H&P (Signed)
The patient was re examined and there is no change in the patients condition since the original H and P. 

## 2012-01-30 NOTE — Transfer of Care (Signed)
Immediate Anesthesia Transfer of Care Note  Patient: Savannah Rocha  Procedure(s) Performed: Procedure(s) (LRB) with comments: CATARACT EXTRACTION PHACO AND INTRAOCULAR LENS PLACEMENT (IOC) (Left) - CDE:8.36  Patient Location: PACU and Short Stay  Anesthesia Type:MAC  Level of Consciousness: awake  Airway & Oxygen Therapy: Patient Spontanous Breathing  Post-op Assessment: Report given to PACU RN  Post vital signs: Reviewed  Complications: No apparent anesthesia complications

## 2012-01-30 NOTE — Anesthesia Postprocedure Evaluation (Signed)
  Anesthesia Post-op Note  Patient: Savannah Rocha  Procedure(s) Performed: Procedure(s) (LRB) with comments: CATARACT EXTRACTION PHACO AND INTRAOCULAR LENS PLACEMENT (IOC) (Left) - CDE:8.36  Patient Location: PACU and Short Stay  Anesthesia Type:MAC  Level of Consciousness: awake, alert  and oriented  Airway and Oxygen Therapy: Patient Spontanous Breathing  Post-op Pain: none  Post-op Assessment: Post-op Vital signs reviewed, Patient's Cardiovascular Status Stable, Respiratory Function Stable, Patent Airway and No signs of Nausea or vomiting  Post-op Vital Signs: Reviewed and stable  Complications: No apparent anesthesia complications

## 2012-01-30 NOTE — Op Note (Signed)
Patient brought to the operating room and prepped and draped in the usual manner.  Lid speculum inserted in left eye.  Stab incision made at the twelve o'clock position.  Provisc instilled in the anterior chamber.   A 2.4 mm. Stab incision was made temporally.  An anterior capsulotomy was done with a bent 25 gauge needle.  The nucleus was hydrodissected.  The Phaco tip was inserted in the anterior chamber and the nucleus was emulsified.  CDE was 8.36.  The cortical material was then removed with the I and A tip.  Posterior capsule was the polished.  The anterior chamber was deepened with Provisc.  A 16.0 Alcon [id number] Toric IOL was then inserted in the capsular bag. The Toric IOL was positioned at the 73 degree position.   Provisc was then removed with the I and A tip.  The wound was then hydrated.  Patient sent to the Recovery Room in good condition with follow up in my office.

## 2012-01-30 NOTE — Anesthesia Preprocedure Evaluation (Signed)
Anesthesia Evaluation  Patient identified by MRN, date of birth, ID band Patient awake    Reviewed: Allergy & Precautions, H&P , NPO status , Patient's Chart, lab work & pertinent test results  Airway Mallampati: II      Dental  (+) Teeth Intact   Pulmonary neg pulmonary ROS,  breath sounds clear to auscultation        Cardiovascular negative cardio ROS  Rhythm:Regular Rate:Normal     Neuro/Psych PSYCHIATRIC DISORDERS Depression    GI/Hepatic GERD-  Medicated,  Endo/Other    Renal/GU      Musculoskeletal   Abdominal   Peds  Hematology   Anesthesia Other Findings   Reproductive/Obstetrics                           Anesthesia Physical Anesthesia Plan  ASA: II  Anesthesia Plan: MAC   Post-op Pain Management:    Induction: Intravenous  Airway Management Planned: Nasal Cannula  Additional Equipment:   Intra-op Plan:   Post-operative Plan:   Informed Consent: I have reviewed the patients History and Physical, chart, labs and discussed the procedure including the risks, benefits and alternatives for the proposed anesthesia with the patient or authorized representative who has indicated his/her understanding and acceptance.     Plan Discussed with:   Anesthesia Plan Comments:        Anesthesia Quick Evaluation  

## 2012-01-31 ENCOUNTER — Encounter (HOSPITAL_COMMUNITY): Payer: Self-pay | Admitting: Ophthalmology

## 2012-02-19 ENCOUNTER — Telehealth: Payer: Self-pay | Admitting: Family Medicine

## 2012-02-19 MED ORDER — CYCLOBENZAPRINE HCL 10 MG PO TABS: 10.0000 mg | ORAL_TABLET | Freq: Every day | ORAL | Status: DC | PRN

## 2012-02-19 MED ORDER — ALPRAZOLAM 0.5 MG PO TABS: 0.5000 mg | ORAL_TABLET | Freq: Two times a day (BID) | ORAL | Status: DC

## 2012-02-19 NOTE — Telephone Encounter (Signed)
Printed to be signed.  

## 2012-02-20 ENCOUNTER — Telehealth: Payer: Self-pay | Admitting: Family Medicine

## 2012-02-20 NOTE — Telephone Encounter (Signed)
Sent 12/16

## 2012-02-21 ENCOUNTER — Other Ambulatory Visit: Payer: Self-pay | Admitting: Family Medicine

## 2012-03-01 ENCOUNTER — Other Ambulatory Visit: Payer: Self-pay | Admitting: Family Medicine

## 2012-03-19 ENCOUNTER — Encounter: Payer: Self-pay | Admitting: Family Medicine

## 2012-03-19 ENCOUNTER — Ambulatory Visit (INDEPENDENT_AMBULATORY_CARE_PROVIDER_SITE_OTHER): Payer: Medicare Other | Admitting: Family Medicine

## 2012-03-19 VITALS — BP 86/62 | HR 98 | Resp 18 | Ht 65.75 in | Wt 158.0 lb

## 2012-03-19 DIAGNOSIS — F329 Major depressive disorder, single episode, unspecified: Secondary | ICD-10-CM

## 2012-03-19 DIAGNOSIS — R5383 Other fatigue: Secondary | ICD-10-CM

## 2012-03-19 DIAGNOSIS — R202 Paresthesia of skin: Secondary | ICD-10-CM

## 2012-03-19 DIAGNOSIS — R7309 Other abnormal glucose: Secondary | ICD-10-CM

## 2012-03-19 DIAGNOSIS — M797 Fibromyalgia: Secondary | ICD-10-CM

## 2012-03-19 DIAGNOSIS — R5381 Other malaise: Secondary | ICD-10-CM

## 2012-03-19 DIAGNOSIS — N3000 Acute cystitis without hematuria: Secondary | ICD-10-CM

## 2012-03-19 DIAGNOSIS — R3989 Other symptoms and signs involving the genitourinary system: Secondary | ICD-10-CM

## 2012-03-19 DIAGNOSIS — I959 Hypotension, unspecified: Secondary | ICD-10-CM

## 2012-03-19 DIAGNOSIS — IMO0001 Reserved for inherently not codable concepts without codable children: Secondary | ICD-10-CM

## 2012-03-19 DIAGNOSIS — F32A Depression, unspecified: Secondary | ICD-10-CM

## 2012-03-19 DIAGNOSIS — R399 Unspecified symptoms and signs involving the genitourinary system: Secondary | ICD-10-CM

## 2012-03-19 DIAGNOSIS — E785 Hyperlipidemia, unspecified: Secondary | ICD-10-CM

## 2012-03-19 DIAGNOSIS — R209 Unspecified disturbances of skin sensation: Secondary | ICD-10-CM

## 2012-03-19 DIAGNOSIS — R739 Hyperglycemia, unspecified: Secondary | ICD-10-CM

## 2012-03-19 DIAGNOSIS — F3289 Other specified depressive episodes: Secondary | ICD-10-CM

## 2012-03-19 LAB — CBC WITH DIFFERENTIAL/PLATELET
Basophils Relative: 0 % (ref 0–1)
Eosinophils Absolute: 0.2 10*3/uL (ref 0.0–0.7)
Hemoglobin: 12.4 g/dL (ref 12.0–15.0)
MCH: 29 pg (ref 26.0–34.0)
MCHC: 34.4 g/dL (ref 30.0–36.0)
Monocytes Relative: 7 % (ref 3–12)
Neutrophils Relative %: 64 % (ref 43–77)
RDW: 14.2 % (ref 11.5–15.5)

## 2012-03-19 LAB — LIPID PANEL
LDL Cholesterol: 115 mg/dL — ABNORMAL HIGH (ref 0–99)
Total CHOL/HDL Ratio: 4.4 Ratio
VLDL: 18 mg/dL (ref 0–40)

## 2012-03-19 LAB — COMPREHENSIVE METABOLIC PANEL
ALT: 8 U/L (ref 0–35)
AST: 16 U/L (ref 0–37)
Albumin: 4.4 g/dL (ref 3.5–5.2)
Alkaline Phosphatase: 93 U/L (ref 39–117)
Potassium: 4.1 mEq/L (ref 3.5–5.3)
Sodium: 141 mEq/L (ref 135–145)
Total Protein: 7.1 g/dL (ref 6.0–8.3)

## 2012-03-19 LAB — TSH: TSH: 0.6 u[IU]/mL (ref 0.350–4.500)

## 2012-03-19 MED ORDER — GEMFIBROZIL 600 MG PO TABS: ORAL_TABLET | ORAL | Status: DC

## 2012-03-19 MED ORDER — TOPIRAMATE 100 MG PO TABS: ORAL_TABLET | ORAL | Status: DC

## 2012-03-19 MED ORDER — SIMVASTATIN 40 MG PO TABS: 40.0000 mg | ORAL_TABLET | Freq: Every day | ORAL | Status: DC

## 2012-03-19 MED ORDER — SERTRALINE HCL 100 MG PO TABS: 100.0000 mg | ORAL_TABLET | Freq: Every day | ORAL | Status: DC

## 2012-03-19 MED ORDER — SUMATRIPTAN SUCCINATE 100 MG PO TABS: 100.0000 mg | ORAL_TABLET | Freq: Every day | ORAL | Status: DC | PRN

## 2012-03-19 MED ORDER — ALPRAZOLAM 0.5 MG PO TABS: 0.5000 mg | ORAL_TABLET | Freq: Two times a day (BID) | ORAL | Status: DC

## 2012-03-19 MED ORDER — PROMETHAZINE HCL 25 MG PO TABS: 25.0000 mg | ORAL_TABLET | Freq: Four times a day (QID) | ORAL | Status: DC | PRN

## 2012-03-19 MED ORDER — CEPHALEXIN 500 MG PO CAPS: 500.0000 mg | ORAL_CAPSULE | Freq: Two times a day (BID) | ORAL | Status: DC

## 2012-03-19 MED ORDER — CEPHALEXIN 500 MG PO CAPS: 500.0000 mg | ORAL_CAPSULE | Freq: Two times a day (BID) | ORAL | Status: AC

## 2012-03-19 MED ORDER — CYCLOBENZAPRINE HCL 10 MG PO TABS: 10.0000 mg | ORAL_TABLET | Freq: Every day | ORAL | Status: DC | PRN

## 2012-03-19 MED ORDER — OMEPRAZOLE 20 MG PO CPDR: 20.0000 mg | DELAYED_RELEASE_CAPSULE | Freq: Every day | ORAL | Status: AC

## 2012-03-19 NOTE — Progress Notes (Signed)
  Subjective:    Patient ID: Savannah Rocha, female    DOB: 06/16/1955, 57 y.o.   MRN: [id number]  HPI Patient presents to followup chronic medical problems. She is concerned of UTI she's been having increased urinary frequency over the past week with a foul odor to her urine darkening to the urine she has some mild dysuria but overall no pain. Denies nausea vomiting has been very fatigued and worn out over the past week as well. She gets some tingling and numbness that comes a goes, no burning sensation in her feet for many months with her fibromyalgia wanted to be checked for diabetes. She is doing well on her Zoloft and it did not help for her insomnia   Review of Systems   GEN- + fatigue, fever, weight loss,weakness, recent illness HEENT- denies eye drainage, change in vision, nasal discharge, CVS- denies chest pain, palpitations RESP- denies SOB, cough, wheeze ABD- denies N/V, change in stools, abd pain GU- + dysuria, hematuria, dribbling, incontinence, + frequency  MSK- denies joint pain, muscle aches, injury Neuro- denies headache, dizziness, syncope, seizure activity      Objective:   Physical Exam GEN- NAD, alert and oriented x3 HEENT- PERRL, EOMI, non injected sclera, pink conjunctiva, MMM, oropharynx clear Neck- Supple, no thryomegaly CVS- RRR, no murmur RESP-CTAB ABD-NABS,soft,NT,ND, no CVA Tenderness EXT- No edema Pulses- Radial, DP- 2+ Psych- flat affect, not anxious appearing  NEURO- normal sensation in lower extr, normal tone, strength equal bilat      Assessment & Plan:

## 2012-03-19 NOTE — Patient Instructions (Addendum)
Antibiotics for urine infection Be liberal with his salt as well increase her blood pressures Get the fasting labs done to- we will call with results and further instructions Continue all other medications Followup in 3 months

## 2012-03-20 LAB — POCT URINALYSIS DIPSTICK
Bilirubin, UA: NEGATIVE
Ketones, UA: NEGATIVE
pH, UA: 7

## 2012-03-21 DIAGNOSIS — I959 Hypotension, unspecified: Secondary | ICD-10-CM | POA: Insufficient documentation

## 2012-03-21 DIAGNOSIS — R5383 Other fatigue: Secondary | ICD-10-CM | POA: Insufficient documentation

## 2012-03-21 DIAGNOSIS — N3000 Acute cystitis without hematuria: Secondary | ICD-10-CM | POA: Insufficient documentation

## 2012-03-21 DIAGNOSIS — R202 Paresthesia of skin: Secondary | ICD-10-CM | POA: Insufficient documentation

## 2012-03-21 NOTE — Assessment & Plan Note (Signed)
This is likley related to her fibromyalgia, I will go ahead and check B12 and TSH level on her  BP also a little low today

## 2012-03-21 NOTE — Assessment & Plan Note (Signed)
She tends to run a lower blood pressure. She can be liberal with salt intake and make sure she keeps up on fluids. No current medications causing hypotension. No signs of sepsis with her UTI

## 2012-03-21 NOTE — Assessment & Plan Note (Signed)
Antibiotics given 

## 2012-03-21 NOTE — Assessment & Plan Note (Addendum)
She has some mild tingling and numbness, check A1C , had an elevated glucose recently on fasting labs, will not start any other meds at this time, possible early neuroapthy or related to fibromyalgia

## 2012-03-21 NOTE — Assessment & Plan Note (Signed)
FLP looks good, TG within normal range on lopid

## 2012-03-21 NOTE — Assessment & Plan Note (Signed)
Affect a little low today, continue on zoloft and xanax, states she is doing well

## 2012-03-21 NOTE — Assessment & Plan Note (Signed)
Unchanged, continue current meds 

## 2012-06-18 ENCOUNTER — Ambulatory Visit (HOSPITAL_COMMUNITY)
Admission: RE | Admit: 2012-06-18 | Discharge: 2012-06-18 | Disposition: A | Discharge status: Home / Self Care | Payer: Medicare Other | Source: Ambulatory Visit | Attending: Family Medicine | Admitting: Family Medicine

## 2012-06-18 ENCOUNTER — Encounter: Payer: Self-pay | Admitting: Family Medicine

## 2012-06-18 ENCOUNTER — Ambulatory Visit (INDEPENDENT_AMBULATORY_CARE_PROVIDER_SITE_OTHER): Payer: Medicare Other | Admitting: Family Medicine

## 2012-06-18 VITALS — BP 108/72 | HR 97 | Resp 16 | Wt 157.4 lb

## 2012-06-18 DIAGNOSIS — M25519 Pain in unspecified shoulder: Secondary | ICD-10-CM | POA: Insufficient documentation

## 2012-06-18 DIAGNOSIS — S46019A Strain of muscle(s) and tendon(s) of the rotator cuff of unspecified shoulder, initial encounter: Secondary | ICD-10-CM | POA: Insufficient documentation

## 2012-06-18 DIAGNOSIS — S46012A Strain of muscle(s) and tendon(s) of the rotator cuff of left shoulder, initial encounter: Secondary | ICD-10-CM

## 2012-06-18 DIAGNOSIS — M25512 Pain in left shoulder: Secondary | ICD-10-CM

## 2012-06-18 DIAGNOSIS — G43909 Migraine, unspecified, not intractable, without status migrainosus: Secondary | ICD-10-CM

## 2012-06-18 DIAGNOSIS — F32A Depression, unspecified: Secondary | ICD-10-CM

## 2012-06-18 DIAGNOSIS — S43429A Sprain of unspecified rotator cuff capsule, initial encounter: Secondary | ICD-10-CM

## 2012-06-18 DIAGNOSIS — M898X9 Other specified disorders of bone, unspecified site: Secondary | ICD-10-CM | POA: Insufficient documentation

## 2012-06-18 DIAGNOSIS — F3289 Other specified depressive episodes: Secondary | ICD-10-CM

## 2012-06-18 DIAGNOSIS — F329 Major depressive disorder, single episode, unspecified: Secondary | ICD-10-CM

## 2012-06-18 MED ORDER — NAPROXEN 500 MG PO TABS: 500.0000 mg | ORAL_TABLET | Freq: Two times a day (BID) | ORAL | Status: DC

## 2012-06-18 MED ORDER — CYCLOBENZAPRINE HCL 10 MG PO TABS: 10.0000 mg | ORAL_TABLET | Freq: Every day | ORAL | Status: DC | PRN

## 2012-06-18 MED ORDER — SERTRALINE HCL 100 MG PO TABS: 100.0000 mg | ORAL_TABLET | Freq: Every day | ORAL | Status: DC

## 2012-06-18 MED ORDER — SIMVASTATIN 40 MG PO TABS: 40.0000 mg | ORAL_TABLET | Freq: Every day | ORAL | Status: DC

## 2012-06-18 MED ORDER — GEMFIBROZIL 600 MG PO TABS: ORAL_TABLET | ORAL | Status: DC

## 2012-06-18 MED ORDER — ALPRAZOLAM 0.5 MG PO TABS: 0.5000 mg | ORAL_TABLET | Freq: Two times a day (BID) | ORAL | Status: DC

## 2012-06-18 NOTE — Patient Instructions (Addendum)
Get the xray done Referral to orthopedics surgery  Start the naprosyn twice a day  Continue current medications F/U 3 months

## 2012-06-18 NOTE — Progress Notes (Signed)
  Subjective:    Patient ID: Savannah Rocha, female    DOB: 27-Feb-1956, 57 y.o.   MRN: [id number]  HPI  Patient here to follow chronic medical problems. She's complaining of left shoulder pain for the past couple weeks she does not remain particular injury she has pain when lifting up of the head as well as any rotation with arm. She denies any swelling. Pain is worsening of the past week. She did take a couple of doses of Aleve which helps some. Denies any neck trauma or pain. Reviewed medication she tapered herself off of Topamax because she states this was not helping her headaches. She does let me know that her brother was recently diagnosed with aggressive lung cancer Review of Systems  GEN- denies fatigue, fever, weight loss,weakness, recent illness HEENT- denies eye drainage, change in vision, nasal discharge, CVS- denies chest pain, palpitations RESP- denies SOB, cough, wheeze ABD- denies N/V, change in stools, abd pain GU- denies dysuria, hematuria, dribbling, incontinence MSK- + joint pain, muscle aches, injury Neuro- denies headache, dizziness, syncope, seizure activity      Objective:   Physical Exam  GEN- NAD, alert and oriented x3 HEENT- PERRL, EOMI, non injected sclera, pink conjunctiva, MMM, oropharynx clear Neck- Supple,fair ROM, surgical scar posterior neck CVS- RRR, no murmur RESP-CTAB Neuro- CNII-XII in tact, no focal deficits, normal muscle tone UE MSK- normal inspection shoulder, Left side + empty can, pain with external rotation, +neer's. Biceps in tact, strength decreased compared to right EXT- No edema Pulses- Radial, DP- 2+       Assessment & Plan:

## 2012-06-18 NOTE — Assessment & Plan Note (Signed)
Stable, medications refilled.  ?

## 2012-06-18 NOTE — Assessment & Plan Note (Addendum)
Followed by neurology she tapered herself off the Topamax. She will continue the Imitrex and she also has hydrocodone for severe headaches I discussed with her not taking herself off her medications

## 2012-06-18 NOTE — Assessment & Plan Note (Signed)
Based on exam a concern for rotator cuff strain at the minimal. Is possible she has a small tear. I will obtain an x-ray as well to evaluate the actual joint. I will refer her to orthopedic surgery. She will start on Naprosyn twice a day

## 2012-07-03 ENCOUNTER — Telehealth: Payer: Self-pay | Admitting: *Deleted

## 2012-07-03 ENCOUNTER — Other Ambulatory Visit: Payer: Self-pay | Admitting: *Deleted

## 2012-07-03 ENCOUNTER — Encounter: Payer: Self-pay | Admitting: Orthopedic Surgery

## 2012-07-03 ENCOUNTER — Ambulatory Visit (INDEPENDENT_AMBULATORY_CARE_PROVIDER_SITE_OTHER): Payer: Medicare Other | Admitting: Orthopedic Surgery

## 2012-07-03 VITALS — BP 102/62 | Ht 66.0 in | Wt 162.0 lb

## 2012-07-03 DIAGNOSIS — M75102 Unspecified rotator cuff tear or rupture of left shoulder, not specified as traumatic: Secondary | ICD-10-CM

## 2012-07-03 DIAGNOSIS — M67919 Unspecified disorder of synovium and tendon, unspecified shoulder: Secondary | ICD-10-CM

## 2012-07-03 DIAGNOSIS — M719 Bursopathy, unspecified: Secondary | ICD-10-CM

## 2012-07-03 DIAGNOSIS — G5600 Carpal tunnel syndrome, unspecified upper limb: Secondary | ICD-10-CM | POA: Insufficient documentation

## 2012-07-03 NOTE — Telephone Encounter (Signed)
Faxed referral and office notes to [facility] for Nerve Conduction Study. Awaiting appointment.

## 2012-07-03 NOTE — Progress Notes (Addendum)
Subjective:    Patient ID: Savannah Rocha, female    DOB: Apr 04, 1955, 57 y.o.   MRN: [id number]  HPI Comments: The patient was vacuuming after that her shoulder started hurting she lost motion complains of crunching and crepitance and she also complains of bilateral upper extremity numbness and tingling in her hands with pain  Shoulder Pain  The pain is present in the right shoulder, left fingers, right hand, right fingers and left hand. This is a new problem. The current episode started 1 to 4 weeks ago. There has been no history of extremity trauma. The problem occurs constantly. The problem has been unchanged. The quality of the pain is described as aching, burning and sharp. The pain is moderate. Associated symptoms include a limited range of motion and tingling. Pertinent negatives include no fever, inability to bear weight, itching, joint locking, numbness or stiffness.      Review of Systems  Constitutional: Negative for fever.  Musculoskeletal: Negative for stiffness.  Skin: Negative for itching.  Neurological: Positive for tingling. Negative for numbness.  all other systems were reviewed and were negative      Objective:   Physical Exam BP 102/62  Ht 5\' 6"  (1.676 m)  Wt 162 lb (73.483 kg)  BMI 26.16 kg/m2  General overall her grooming and hygiene are normal her body habitus is medium in she has no deformity she has good grooming Mood Affect AAO X 3 Gait no gait abnormalities  RUE (include skin) She has numbness and tingling and decreased sensation in the right hand is no swelling range of motion is full no instability and muscle tone is normal shoulder range of motion full impingement sign negative  LUE She has numbness and tingling and decreased sensation in the left hand as well without swelling. Range of motion is normal. Normal stability is noted muscle tone is normal. Shoulder range of motion is limited in flexion as well as internal rotation she has no weakness  in the rotator cuff. The impingement sign is positive. Her muscle tone is normal her shoulder is stable there is some tenderness just inferior to the acromion posteriorly  Lower extremity exam  The right and left lower extremity:  Inspection and palpation revealed no tenderness or abnormality in alignment in the lower extremities. Range of motion is full.  Strength is grade 5.  All joints are stable.  CDV all 4 extremities have normal pulses and perfusion LYMPH there is no palpable lymphadenopathy DTR equal and symmetric Balance normal  MDM [moderate]  New dx CTS (carpal tunnel syndrome), unspecified laterality  Rotator cuff syndrome of left shoulder  Nerve conduction study to be completed to evaluate for carpal tunnel syndrome  Order and read XR my interpretation of the left shoulder x-rays at the shoulder film is normal  Subacromial Shoulder Injection Procedure Note  Pre-operative Diagnosis: left RC Syndrome  Post-operative Diagnosis: same  Indications: pain   Anesthesia: ethyl chloride   Procedure Details   Verbal consent was obtained for the procedure. The shoulder was prepped withalcohol and the skin was anesthetized. A 20 gauge needle was advanced into the subacromial space through posterior approach without difficulty  The space was then injected with 3 ml 1% lidocaine and 1 ml of depomedrol. The injection site was cleansed with isopropyl alcohol and a dressing was applied.  Complications:  None; patient tolerated the procedure well.         Assessment & Plan:  Followup scheduled for 6 weeks with nerve conduction  study from [facility]  CTS (carpal tunnel syndrome), unspecified laterality  Rotator cuff syndrome of left shoulder

## 2012-07-03 NOTE — Patient Instructions (Addendum)
Refer to [facility] for NCS for Carpal Tunnel Syndrome.  Rotator Cuff Tendonitis  The rotator cuff is the collection of all the muscles and tendons (the supraspinatus, infraspinatus, subscapularis, and teres minor muscles and their tendons) that help your shoulder stay in place. This unit holds the head of the upper arm bone (humerus) in the cup (fossa) of the shoulder blade (scapula). Basically, it connects the arm to the shoulder. Tendinitis is a swelling and irritation of the tissue, called cord like structures (tendons) that connect muscle to bone. It usually is caused by overusing the joint involved. When the tissue surrounding a tendon (the synovium) becomes inflamed, it is called tenosynovitis. This also is often the result of overuse in people whose jobs require repetitive (over and over again) types of motion. HOME CARE INSTRUCTIONS     Apply ice to the injury for 15 to 20 minutes, 3 to 4 times per day. Put the ice in a plastic bag and place a towel between the bag of ice and your skin.  Try to avoid use other than gentle range of motion while your shoulder is painful. Use and exercise only as directed by your caregiver. Stop exercises or range of motion if pain or discomfort increases, unless directed otherwise by your caregiver.  Only take over-the-counter or prescription medicines for pain, discomfort, or fever as directed by your caregiver.    You may want to sleep on several pillows at night to lessen swelling and pain. SEEK IMMEDIATE MEDICAL CARE IF:   Pain in your shoulder increases or new pain develops in your arm, hand, or fingers and is not relieved with medications.  You develop new, unexplained symptoms, especially increased numbness in the hands or loss of strength, or you develop any worsening of the problems which brought you in for care.  Your arm, hand, or fingers are numb or tingling.  Your arm, hand, or fingers are swollen, painful, or turn white or  blue. Document Released: 05/13/2003 Document Revised: 05/15/2011 Document Reviewed: 12/19/2007 [facility] Patient Information 2013 [place], [place].   Carpal Tunnel Syndrome The carpal tunnel is a narrow area located on the palm side of your wrist. The tunnel is formed by the wrist bones and ligaments. Nerves, blood vessels, and tendons pass through the carpal tunnel. Repeated wrist motion or certain diseases may cause swelling within the tunnel. This swelling pinches the main nerve in the wrist (median nerve) and causes the painful hand and arm condition called carpal tunnel syndrome. CAUSES   Repeated wrist motions.  Wrist injuries.  Certain diseases like arthritis, diabetes, alcoholism, hyperthyroidism, and kidney failure.  Obesity.  Pregnancy. SYMPTOMS   A "pins and needles" feeling in your fingers or hand.  Tingling or numbness in your fingers or hand.  An aching feeling in your entire arm.  Wrist pain that goes up your arm to your shoulder.  Pain that goes down into your palm or fingers.  A weak feeling in your hands. DIAGNOSIS  Your caregiver will take your history and perform a physical exam. An electromyography test may be needed. This test measures electrical signals sent out by the muscles. The electrical signals are usually slowed by carpal tunnel syndrome. You may also need X-rays. TREATMENT  Carpal tunnel syndrome may clear up by itself. Your caregiver may recommend a wrist splint or medicine such as a nonsteroidal anti-inflammatory medicine. Cortisone injections may help. Sometimes, surgery may be needed to free the pinched nerve.  HOME CARE INSTRUCTIONS   Take  all medicine as directed by your caregiver. Only take over-the-counter or prescription medicines for pain, discomfort, or fever as directed by your caregiver.  If you were given a splint to keep your wrist from bending, wear it as directed. It is important to wear the splint at night. Wear the splint for as  Savannah Rocha as you have pain or numbness in your hand, arm, or wrist. This may take 1 to 2 months.  Rest your wrist from any activity that may be causing your pain. If your symptoms are work-related, you may need to talk to your employer about changing to a job that does not require using your wrist.  Put ice on your wrist after Savannah Rocha periods of wrist activity.  Put ice in a plastic bag.  Place a towel between your skin and the bag.  Leave the ice on for 15 to 20 minutes, 3 to 4 times a day.  Keep all follow-up visits as directed by your caregiver. This includes any orthopedic referrals, physical therapy, and rehabilitation. Any delay in getting necessary care could result in a delay or failure of your condition to heal. SEEK IMMEDIATE MEDICAL CARE IF:   You have new, unexplained symptoms.  Your symptoms get worse and are not helped or controlled with medicines. MAKE SURE YOU:   Understand these instructions.  Will watch your condition.  Will get help right away if you are not doing well or get worse. Document Released: 02/18/2000 Document Revised: 05/15/2011 Document Reviewed: 01/06/2011 [facility] Patient Information 2013 [place], [place].  You have received a steroid shot. 15% of patients experience increased pain at the injection site with in the next 24 hours. This is best treated with ice and tylenol extra strength 2 tabs every 8 hours. If you are still having pain please call the office.

## 2012-07-16 NOTE — Telephone Encounter (Signed)
Appointment with [facility] 07/18/12 at 2:00 pm. Patient aware.

## 2012-08-05 ENCOUNTER — Encounter (HOSPITAL_COMMUNITY): Payer: Self-pay | Admitting: Pharmacy Technician

## 2012-08-13 ENCOUNTER — Encounter (HOSPITAL_COMMUNITY): Admission: RE | Payer: Self-pay | Source: Ambulatory Visit

## 2012-08-13 ENCOUNTER — Ambulatory Visit (HOSPITAL_COMMUNITY): Admission: RE | Admit: 2012-08-13 | Payer: Medicare Other | Source: Ambulatory Visit | Admitting: Ophthalmology

## 2012-08-13 SURGERY — TREATMENT, USING YAG LASER
Anesthesia: LOCAL | Laterality: Left

## 2012-08-13 MED ORDER — TROPICAMIDE 1 % OP SOLN
OPHTHALMIC | Status: AC
  Filled 2012-08-13: qty 3

## 2012-08-13 NOTE — H&P (Signed)
The patient was re examined and there is no change in the patients condition since the original H and P. 

## 2012-08-13 NOTE — Progress Notes (Signed)
Patient cancelled procedure and will reschedule

## 2012-08-20 ENCOUNTER — Ambulatory Visit: Payer: Medicare Other | Admitting: Orthopedic Surgery

## 2012-08-22 ENCOUNTER — Encounter (HOSPITAL_COMMUNITY): Payer: Self-pay

## 2012-08-23 ENCOUNTER — Ambulatory Visit (INDEPENDENT_AMBULATORY_CARE_PROVIDER_SITE_OTHER): Payer: Medicare Other

## 2012-08-23 DIAGNOSIS — R209 Unspecified disturbances of skin sensation: Secondary | ICD-10-CM

## 2012-08-23 DIAGNOSIS — M79609 Pain in unspecified limb: Secondary | ICD-10-CM

## 2012-08-23 NOTE — Procedures (Signed)
History of present illness:  56 years old Caucasian female, presenting with neck pain, bilateral shoulder heaviness for few months, also intermittent bilateral hands, and feet paresthesia, previous cervical, and lumbar decompression surgery.  Examination: Bilateral upper and lower extremity motor strength was normal, with exception of limitation of left shoulder motor examination due to left shoulder pain, deep tendon reflex were present and symmetric,  Nerve conduction studies:   Bilateral median, ulnar, sensory and motor responses were normal.   Electromyography:  Selected needle examination was performed at right upper extremity muscles, and the right cervical paraspinal muscles.  Needle examination of the right pronator teres, extensor digitorum communis, triceps, biceps, deltoid, was normal.  There was no spontaneous activity at right cervical paraspinal muscles, right C5, 6, 7. There was well-healed surgical midline scars.  In conclusion:  This is a normal study. There is no electrodiagnostic evidence of right upper extremity neuropathy, or right cervical radiculopathy.

## 2012-08-27 ENCOUNTER — Encounter (HOSPITAL_COMMUNITY)
Admission: RE | Disposition: A | Discharge status: Home / Self Care | Payer: Self-pay | Source: Ambulatory Visit | Attending: Ophthalmology

## 2012-08-27 ENCOUNTER — Encounter (HOSPITAL_COMMUNITY): Payer: Self-pay | Admitting: *Deleted

## 2012-08-27 ENCOUNTER — Ambulatory Visit (HOSPITAL_COMMUNITY)
Admission: RE | Admit: 2012-08-27 | Discharge: 2012-08-27 | Disposition: A | Discharge status: Home / Self Care | Payer: Medicare Other | Source: Ambulatory Visit | Attending: Ophthalmology | Admitting: Ophthalmology

## 2012-08-27 DIAGNOSIS — H26499 Other secondary cataract, unspecified eye: Secondary | ICD-10-CM | POA: Insufficient documentation

## 2012-08-27 HISTORY — PX: YAG LASER APPLICATION: SHX6189

## 2012-08-27 SURGERY — TREATMENT, USING YAG LASER
Anesthesia: LOCAL | Laterality: Right

## 2012-08-27 MED ORDER — TROPICAMIDE 1 % OP SOLN
1.0000 [drp] | OPHTHALMIC | Status: AC
  Administered 2012-08-27 (×2): 1 [drp] via OPHTHALMIC

## 2012-08-27 MED ORDER — TROPICAMIDE 1 % OP SOLN
OPHTHALMIC | Status: AC
  Filled 2012-08-27: qty 3

## 2012-08-27 NOTE — H&P (Signed)
The patient was re examined and there is no change in the patients condition since the original H and P. 

## 2012-08-27 NOTE — Brief Op Note (Addendum)
[place] [facility]    08/27/2012  Griffen Frayne T. Nile Riggs, MD  Pre-op diagnosis: opacification right eye  Post-op diagnosis: opacification right eye   Yag Laser Self Test Completedyes. Procedure: Posterior Capsulotomy, right eye.  Eye Protection Worn by Staff yes. Laser In Use Sign on Door yes.  Laser: Nd:YAG Spot Size: Fixed Burst Mode: III Power Setting: 3.3 mJ/burst  Number of shots: 18 Total energy delivered: 59.1 mJ  Patency of the peripheral iridotomy was confirmed visually.  The patient tolerated the procedure without difficulty. No complications were encountered.    The patient was discharged home with the instructions to continue all her current glaucoma medications, if any.   Patient instructed to go to office at 0100 for intraocular pressure check.  Patient verbalizes understanding of discharge instructions yes.

## 2012-08-27 NOTE — Progress Notes (Signed)
Patient given post op instructions. With appt to see dr Nile Riggs between 1:00-2:00

## 2012-08-29 ENCOUNTER — Encounter (HOSPITAL_COMMUNITY): Payer: Self-pay | Admitting: Ophthalmology

## 2012-09-08 ENCOUNTER — Other Ambulatory Visit: Payer: Self-pay

## 2012-09-08 ENCOUNTER — Emergency Department (HOSPITAL_COMMUNITY): Payer: Medicare Other

## 2012-09-08 ENCOUNTER — Emergency Department (HOSPITAL_COMMUNITY)
Admission: EM | Admit: 2012-09-08 | Discharge: 2012-09-08 | Disposition: A | Discharge status: Home / Self Care | Payer: Medicare Other | Attending: Emergency Medicine | Admitting: Emergency Medicine

## 2012-09-08 ENCOUNTER — Encounter (HOSPITAL_COMMUNITY): Payer: Self-pay | Admitting: Emergency Medicine

## 2012-09-08 DIAGNOSIS — G8929 Other chronic pain: Secondary | ICD-10-CM | POA: Insufficient documentation

## 2012-09-08 DIAGNOSIS — E785 Hyperlipidemia, unspecified: Secondary | ICD-10-CM | POA: Insufficient documentation

## 2012-09-08 DIAGNOSIS — R42 Dizziness and giddiness: Secondary | ICD-10-CM | POA: Insufficient documentation

## 2012-09-08 DIAGNOSIS — Z8739 Personal history of other diseases of the musculoskeletal system and connective tissue: Secondary | ICD-10-CM | POA: Insufficient documentation

## 2012-09-08 DIAGNOSIS — R110 Nausea: Secondary | ICD-10-CM | POA: Insufficient documentation

## 2012-09-08 DIAGNOSIS — R531 Weakness: Secondary | ICD-10-CM

## 2012-09-08 DIAGNOSIS — R5381 Other malaise: Secondary | ICD-10-CM | POA: Insufficient documentation

## 2012-09-08 DIAGNOSIS — Z79899 Other long term (current) drug therapy: Secondary | ICD-10-CM | POA: Insufficient documentation

## 2012-09-08 DIAGNOSIS — F3289 Other specified depressive episodes: Secondary | ICD-10-CM | POA: Insufficient documentation

## 2012-09-08 DIAGNOSIS — F329 Major depressive disorder, single episode, unspecified: Secondary | ICD-10-CM | POA: Insufficient documentation

## 2012-09-08 DIAGNOSIS — G43909 Migraine, unspecified, not intractable, without status migrainosus: Secondary | ICD-10-CM | POA: Insufficient documentation

## 2012-09-08 DIAGNOSIS — K219 Gastro-esophageal reflux disease without esophagitis: Secondary | ICD-10-CM | POA: Insufficient documentation

## 2012-09-08 DIAGNOSIS — R51 Headache: Secondary | ICD-10-CM | POA: Insufficient documentation

## 2012-09-08 DIAGNOSIS — Z7982 Long term (current) use of aspirin: Secondary | ICD-10-CM | POA: Insufficient documentation

## 2012-09-08 DIAGNOSIS — R61 Generalized hyperhidrosis: Secondary | ICD-10-CM | POA: Insufficient documentation

## 2012-09-08 LAB — CBC WITH DIFFERENTIAL/PLATELET
Basophils Absolute: 0 10*3/uL (ref 0.0–0.1)
HCT: 38 % (ref 36.0–46.0)
Lymphocytes Relative: 20 % (ref 12–46)
Neutro Abs: 5.1 10*3/uL (ref 1.7–7.7)
Platelets: 204 10*3/uL (ref 150–400)
RBC: 4.38 MIL/uL (ref 3.87–5.11)
RDW: 13.7 % (ref 11.5–15.5)
WBC: 7 10*3/uL (ref 4.0–10.5)

## 2012-09-08 LAB — GLUCOSE, CAPILLARY: Glucose-Capillary: 110 mg/dL — ABNORMAL HIGH (ref 70–99)

## 2012-09-08 LAB — TROPONIN I: Troponin I: <0.3 ng/mL (ref <0.30)

## 2012-09-08 LAB — BASIC METABOLIC PANEL
CO2: 25 mEq/L (ref 19–32)
Chloride: 99 mEq/L (ref 96–112)
Sodium: 136 mEq/L (ref 135–145)

## 2012-09-08 MED ORDER — ACETAMINOPHEN 500 MG PO TABS
1000.0000 mg | ORAL_TABLET | Freq: Once | ORAL | Status: AC
  Administered 2012-09-08: 1000 mg via ORAL
  Filled 2012-09-08: qty 2

## 2012-09-08 MED ORDER — SODIUM CHLORIDE 0.9 % IV BOLUS (SEPSIS)
1000.0000 mL | Freq: Once | INTRAVENOUS | Status: AC
  Administered 2012-09-08: 1000 mL via INTRAVENOUS

## 2012-09-08 NOTE — ED Provider Notes (Signed)
History  This chart was scribed for Savannah Lennert, MD by Bennett Scrape, ED Scribe. This patient was seen in room APA02/APA02 and the patient's care was started at 2:10 PM.  CSN: [id number]  Arrival date & time 09/08/12  1342   First MD Initiated Contact with Patient 09/08/12 1410     Chief Complaint  Patient presents with  . Near Syncope  . Headache    Patient is a 57 y.o. female presenting with weakness. The history is provided by the patient (the pt states she was dizzya and felt like she was going to -pass out). No language interpreter was used.  Weakness This is a new problem. The current episode started 3 to 5 hours ago. The problem occurs rarely. The problem has been resolved. Associated symptoms include headaches. Pertinent negatives include no chest pain and no abdominal pain. Nothing aggravates the symptoms.    HPI Comments: Savannah Rocha is a 57 y.o. female who presents to the Emergency Department complaining of constant lightheadedness with associated nausea, HA, generalized weakness and diaphoresis that started suddenly while she was standing in line at the [employer]. She reports feeling the symptoms at rest. Pt denies having prior episodes of similar symptoms. Daughter reports that the pt has been under increased stress recently due to her brother passing away 2 weeks ago. She denies having a h/o cardiac disease or pulmonary conditions. Pt denies any associated CP, SOB and LOC as associated symptoms. She has a h/o HLD and depression. Pt denies smoking and alcohol use.  PCP is Dr. Jeanice Lim  Past Medical History  Diagnosis Date  . Chronic lower back pain   . Depression   . Hyperlipidemia   . GERD (gastroesophageal reflux disease)   . Migraines     last migraine 2 days ago  . Arthritis    Past Surgical History  Procedure Laterality Date  . Lower back     . Abdominal hysterectomy    . Hemorrhoid surgery    . Right knee  1993  . Neck surgery      benign tumor  removed   . Tubal ligation    . Cataract extraction w/phaco  01/16/2012    Procedure: CATARACT EXTRACTION PHACO AND INTRAOCULAR LENS PLACEMENT (IOC);  Surgeon: Loraine Leriche T. Nile Riggs, MD;  Location: [facility];  Service: Ophthalmology;  Laterality: Right;  CDE:6.42  . Cataract extraction w/phaco  01/30/2012    Procedure: CATARACT EXTRACTION PHACO AND INTRAOCULAR LENS PLACEMENT (IOC);  Surgeon: Loraine Leriche T. Nile Riggs, MD;  Location: [facility];  Service: Ophthalmology;  Laterality: Left;  CDE:8.36  . Yag laser application Right 08/27/2012    Procedure: YAG LASER APPLICATION;  Surgeon: Loraine Leriche T. Nile Riggs, MD;  Location: [facility];  Service: Ophthalmology;  Laterality: Right;   Family History  Problem Relation Age of Onset  . Cancer Father     lung  . Depression Sister   . Hypertension Sister   . Hyperlipidemia Sister   . Depression Sister   . Heart disease Brother   . COPD Brother    History  Substance Use Topics  . Smoking status: Never Smoker   . Smokeless tobacco: Not on file  . Alcohol Use: No   No OB history provided.   Review of Systems  Constitutional: Positive for diaphoresis. Negative for appetite change and fatigue.  HENT: Negative for congestion, sinus pressure and ear discharge.   Eyes: Negative for discharge.  Respiratory: Negative for cough.   Cardiovascular: Negative for chest pain.  Gastrointestinal:  Positive for nausea. Negative for abdominal pain and diarrhea.  Genitourinary: Negative for frequency and hematuria.  Musculoskeletal: Negative for back pain.  Skin: Negative for rash.  Neurological: Positive for weakness, light-headedness and headaches. Negative for seizures and syncope.  Psychiatric/Behavioral: Negative for hallucinations.    Allergies  Review of patient's allergies indicates no known allergies.  Home Medications   Current Outpatient Rx  Name  Route  Sig  Dispense  Refill  . ALPRAZolam (XANAX) 0.5 MG tablet   Oral   Take 1 tablet (0.5 mg total) by mouth 2 (two)  times daily.   60 tablet   2   . aspirin 325 MG tablet   Oral   Take 325 mg by mouth daily.         . cyclobenzaprine (FLEXERIL) 10 MG tablet   Oral   Take 1 tablet (10 mg total) by mouth daily as needed. For muscle relaxant   90 tablet   1   . gemfibrozil (LOPID) 600 MG tablet      TAKE 1 TABLET BY MOUTH TWICE DAILY   180 tablet   1     Generic [employer]:UXLKG    600MG   03/01/2012 9:23:57 AM   . omeprazole (PRILOSEC) 20 MG capsule   Oral   Take 1 capsule (20 mg total) by mouth daily.   90 capsule   1   . promethazine (PHENERGAN) 25 MG tablet   Oral   Take 25 mg by mouth every 6 (six) hours as needed for nausea (Migraines). For nausea/vomiting         . sertraline (ZOLOFT) 100 MG tablet   Oral   Take 1 tablet (100 mg total) by mouth daily.   90 tablet   1   . simvastatin (ZOCOR) 40 MG tablet   Oral   Take 1 tablet (40 mg total) by mouth at bedtime.   90 tablet   1   . SUMAtriptan (IMITREX) 100 MG tablet   Oral   Take 1 tablet (100 mg total) by mouth daily as needed. For migraines   90 tablet   1    Triage Vitals: BP 139/80  Pulse 88  Temp(Src) 97.6 F (36.4 C) (Oral)  Resp 24  SpO2 100%  Physical Exam  Nursing note and vitals reviewed. Constitutional: She is oriented to person, place, and time. She appears well-developed and well-nourished. No distress.  HENT:  Head: Normocephalic and atraumatic.  Eyes: Conjunctivae and EOM are normal. No scleral icterus.  Neck: Neck supple. No thyromegaly present.  Cardiovascular: Normal rate and regular rhythm.  Exam reveals no gallop and no friction rub.   No murmur heard. Pulmonary/Chest: Effort normal. No stridor. She has no wheezes. She has no rales. She exhibits no tenderness.  Abdominal: She exhibits no distension. There is no tenderness. There is no rebound.  Musculoskeletal: Normal range of motion. She exhibits no edema.  Lymphadenopathy:    She has no cervical adenopathy.  Neurological: She is alert and  oriented to person, place, and time. Coordination normal.  Skin: Skin is warm and dry. No rash noted. No erythema.  Psychiatric: She has a normal mood and affect. Her behavior is normal.    ED Course  Procedures (including critical care time)  Medications  sodium chloride 0.9 % bolus 1,000 mL (not administered)    DIAGNOSTIC STUDIES: Oxygen Saturation is 100% on room air, normal by my interpretation.    COORDINATION OF CARE: 2:14 PM-Discussed treatment plan which includes IV fluids,  CXR, CT of head, CBC panel, BMP and troponin with pt at bedside and pt agreed to plan.   Labs Reviewed  CBC WITH DIFFERENTIAL  BASIC METABOLIC PANEL  TROPONIN I   No results found.  No diagnosis found.  Date: 09/08/2012  Rate: 86  Rhythm: normal sinus rhythm  QRS Axis: normal  Intervals: normal  ST/T Wave abnormalities: normal  Conduction Disutrbances:none  Narrative Interpretation:   Old EKG Reviewed: none available   MDM    The chart was scribed for me under my direct supervision.  I personally performed the history, physical, and medical decision making and all procedures in the evaluation of this patient.Savannah Lennert, MD 09/08/12 [id number]

## 2012-09-08 NOTE — ED Notes (Signed)
Right pupil 4 and left pupil 2, both reactive

## 2012-09-08 NOTE — ED Notes (Signed)
Pt standing in line and became diaphoretic, weak and dizzy. No loc per Daughter. Pt alert/oriented. Diaphoretic. Clammy upon arrival. C/o nausea/headache and "lightheaded" now. Denies chest pain at any time. C/o gen weakness.

## 2012-09-08 NOTE — ED Notes (Signed)
Daughter also reports pt has been under a lot of stress due to recent passing of brother x 2 wks ago.

## 2012-09-10 ENCOUNTER — Ambulatory Visit (INDEPENDENT_AMBULATORY_CARE_PROVIDER_SITE_OTHER): Payer: Medicare Other | Admitting: Orthopedic Surgery

## 2012-09-10 ENCOUNTER — Encounter: Payer: Self-pay | Admitting: Orthopedic Surgery

## 2012-09-10 VITALS — BP 110/74 | Ht 66.0 in | Wt 162.0 lb

## 2012-09-10 DIAGNOSIS — IMO0001 Reserved for inherently not codable concepts without codable children: Secondary | ICD-10-CM

## 2012-09-10 DIAGNOSIS — R209 Unspecified disturbances of skin sensation: Secondary | ICD-10-CM

## 2012-09-10 DIAGNOSIS — R202 Paresthesia of skin: Secondary | ICD-10-CM

## 2012-09-10 DIAGNOSIS — M75102 Unspecified rotator cuff tear or rupture of left shoulder, not specified as traumatic: Secondary | ICD-10-CM

## 2012-09-10 DIAGNOSIS — M797 Fibromyalgia: Secondary | ICD-10-CM

## 2012-09-10 DIAGNOSIS — M67919 Unspecified disorder of synovium and tendon, unspecified shoulder: Secondary | ICD-10-CM

## 2012-09-10 NOTE — Progress Notes (Signed)
Patient ID: Savannah Rocha, female   DOB: [birth year], 57 y.o.   MRN: [id number] Chief Complaint  Patient presents with  . Results    NCS results    Patient has history of impingement syndrome of the left shoulder treated with injection with improvement but still residual symptoms with activity. She also complains of bilateral upper and lower extremity numbness and tingling she had upper extremity nerve study which showed no evidence of neuropathy or carpal tunnel syndrome  She's followed by a neurologist and would like to see him again regarding what may be peripheral neuropathy although again the diagnostic testing is normal  BP 110/74  Ht 5\' 6"  (1.676 m)  Wt 162 lb (73.483 kg)  BMI 26.16 kg/m2 General appearance is normal, the patient is alert and oriented x3 with normal mood and affect. Painful for elevation but improved range of motion. No rotator cuff weakness    Rotator cuff syndrome Unexplained peripheral neuropathy  I would like her to start some exercises for the left shoulder think strength in the rotator cuff should be sufficient to control her symptoms so she was given a pamphlet for that and advised to get her of her band or tubing with exercise bands to start strengthening exercises over the next 6 weeks

## 2012-09-10 NOTE — Patient Instructions (Addendum)
Start home exercises for shoulder, do exercises for 6 weeks  (you will need rubber tubing to provide resistance for the exercises )  Refer to neurologist Dr Andrey Campanile in with Dr. Sandria Manly

## 2012-09-11 ENCOUNTER — Telehealth: Payer: Self-pay | Admitting: *Deleted

## 2012-09-11 ENCOUNTER — Other Ambulatory Visit: Payer: Self-pay | Admitting: *Deleted

## 2012-09-11 ENCOUNTER — Encounter (HOSPITAL_COMMUNITY): Payer: Self-pay | Admitting: Pharmacy Technician

## 2012-09-11 DIAGNOSIS — R202 Paresthesia of skin: Secondary | ICD-10-CM

## 2012-09-11 NOTE — Telephone Encounter (Signed)
Referral faxed to [facility]. Awaiting appointment.

## 2012-09-12 ENCOUNTER — Ambulatory Visit: Payer: Medicare Other | Admitting: Orthopedic Surgery

## 2012-09-16 ENCOUNTER — Telehealth: Payer: Self-pay | Admitting: Family Medicine

## 2012-09-16 MED ORDER — ALPRAZOLAM 0.5 MG PO TABS: 0.5000 mg | ORAL_TABLET | Freq: Two times a day (BID) | ORAL | Status: DC

## 2012-09-16 NOTE — Telephone Encounter (Signed)
Appointment 10/04/12 at 9:30 with Dr. Anne Hahn. Patient aware of appointment.

## 2012-09-16 NOTE — Telephone Encounter (Signed)
Med phone in °

## 2012-09-16 NOTE — Telephone Encounter (Signed)
Okay to call in. 

## 2012-09-16 NOTE — Telephone Encounter (Signed)
Ok to refill 

## 2012-09-17 ENCOUNTER — Ambulatory Visit: Payer: Medicare Other | Admitting: Family Medicine

## 2012-09-17 ENCOUNTER — Ambulatory Visit: Payer: Self-pay | Admitting: Family Medicine

## 2012-09-24 ENCOUNTER — Encounter (HOSPITAL_COMMUNITY): Payer: Self-pay | Admitting: *Deleted

## 2012-09-24 ENCOUNTER — Encounter (HOSPITAL_COMMUNITY)
Admission: RE | Disposition: A | Discharge status: Home / Self Care | Payer: Self-pay | Source: Ambulatory Visit | Attending: Ophthalmology

## 2012-09-24 ENCOUNTER — Ambulatory Visit (HOSPITAL_COMMUNITY)
Admission: RE | Admit: 2012-09-24 | Discharge: 2012-09-24 | Disposition: A | Discharge status: Home / Self Care | Payer: Medicare Other | Source: Ambulatory Visit | Attending: Ophthalmology | Admitting: Ophthalmology

## 2012-09-24 DIAGNOSIS — H26499 Other secondary cataract, unspecified eye: Secondary | ICD-10-CM | POA: Insufficient documentation

## 2012-09-24 HISTORY — PX: YAG LASER APPLICATION: SHX6189

## 2012-09-24 SURGERY — TREATMENT, USING YAG LASER
Anesthesia: LOCAL | Laterality: Left

## 2012-09-24 MED ORDER — TROPICAMIDE 1 % OP SOLN
OPHTHALMIC | Status: AC
  Filled 2012-09-24: qty 3

## 2012-09-24 MED ORDER — TROPICAMIDE 1 % OP SOLN
1.0000 [drp] | OPHTHALMIC | Status: AC
  Administered 2012-09-24 (×2): 1 [drp] via OPHTHALMIC
  Filled 2012-09-24: qty 2

## 2012-09-24 NOTE — Brief Op Note (Signed)
Savannah Rocha 09/24/2012  Ilsa Bonello T. Nile Riggs, MD  Pre-op diagnosis: opacification left eye  Post-op diagnosis: opacification of left eye  . Procedure: Posterior Capsulotomy, left eye.  Eye Protection Worn by Staff yes. Laser In Use Sign on Door yes.  Laser: Nd:YAG Spot Size: Fixed Burst Mode: III Power Setting: 3.4 mJ/burst  Number of shots: 13 Total energy delivered: 44.8 mJ  The patient tolerated the procedure without difficulty. No complications were encountered.   The patient was discharged home with the instructions to continue all her current glaucoma medications, if any.   Patient instructed to go to office at 0100 for intraocular pressure check.  Patient verbalizes understanding of discharge instructions yes.

## 2012-09-24 NOTE — H&P (Signed)
The patient was re examined and there is no change in the patients condition since the original H and P. 

## 2012-09-24 NOTE — OR Nursing (Signed)
Discharge instructions given with appointment

## 2012-09-25 ENCOUNTER — Encounter (HOSPITAL_COMMUNITY): Payer: Self-pay | Admitting: Ophthalmology

## 2012-10-04 ENCOUNTER — Ambulatory Visit: Payer: Medicare Other | Admitting: Neurology

## 2012-10-21 ENCOUNTER — Telehealth: Payer: Self-pay | Admitting: Family Medicine

## 2012-10-21 NOTE — Telephone Encounter (Signed)
?   OK to Refill  

## 2012-10-21 NOTE — Telephone Encounter (Signed)
Okay to refill? 

## 2012-10-21 NOTE — Telephone Encounter (Signed)
Xanax 0.5 mg tab 1 BID prn #60 last rf 09/16/12

## 2012-10-22 MED ORDER — ALPRAZOLAM 0.5 MG PO TABS: 0.5000 mg | ORAL_TABLET | Freq: Two times a day (BID) | ORAL | Status: DC

## 2012-10-22 NOTE — Telephone Encounter (Signed)
Rx Refilled  

## 2012-10-28 ENCOUNTER — Ambulatory Visit: Payer: Self-pay | Admitting: Neurology

## 2012-10-30 ENCOUNTER — Encounter: Payer: Self-pay | Admitting: Neurology

## 2012-10-30 ENCOUNTER — Ambulatory Visit (INDEPENDENT_AMBULATORY_CARE_PROVIDER_SITE_OTHER): Payer: Medicare Other | Admitting: Neurology

## 2012-10-30 VITALS — BP 101/68 | HR 88 | Ht 65.75 in | Wt 164.0 lb

## 2012-10-30 DIAGNOSIS — G43909 Migraine, unspecified, not intractable, without status migrainosus: Secondary | ICD-10-CM

## 2012-10-30 DIAGNOSIS — D518 Other vitamin B12 deficiency anemias: Secondary | ICD-10-CM

## 2012-10-30 DIAGNOSIS — R209 Unspecified disturbances of skin sensation: Secondary | ICD-10-CM

## 2012-10-30 DIAGNOSIS — R6889 Other general symptoms and signs: Secondary | ICD-10-CM

## 2012-10-30 DIAGNOSIS — R202 Paresthesia of skin: Secondary | ICD-10-CM

## 2012-10-30 MED ORDER — PREGABALIN 50 MG PO CAPS: ORAL_CAPSULE | ORAL | Status: DC

## 2012-10-30 NOTE — Progress Notes (Signed)
Reason for visit: Numbness all 4 extremities  Savannah Rocha is a 58 y.o. female  History of present illness:  Savannah Rocha is a 57 year old right-handed white female with a history of migratory paresthesias of all 4 extremities that has been present for 5 or 6 years. The patient indicates that greater than 10 years ago, she had a resection of a benign tumor, possibly a meningioma, from her cervical spine. A report from an MRI of the cervical spine done in 2003 shows evidence of cord signal hyperintensity at the C2-3 level, possibly residual from the surgery. The patient has been evaluated for possible carpal tunnel syndrome through this office previously, and nerve conduction studies of both arms were unremarkable. The patient has a numbness and tingling sensations that migrate from one extremity to the next, and may involve the hands or the feet. The patient indicates that she feels worse when she is up and active, better when she is resting and trying to sleep. The patient reports that if she stands too long, she has back pain and discomfort down the legs, and weakness in the legs with a tendency to fall. The patient has had 2 lumbosacral spine surgeries previously. The patient does have some balance issues, but she denies any falls. The patient denies problems controlling the bowels or the bladder. The patient comes back to this office for further evaluation. The patient also reports right knee discomfort.  Past Medical History  Diagnosis Date  . Chronic lower back pain   . Depression   . Hyperlipidemia   . GERD (gastroesophageal reflux disease)   . Migraines     last migraine 2 days ago  . Arthritis     Past Surgical History  Procedure Laterality Date  . Lower back     . Abdominal hysterectomy    . Hemorrhoid surgery    . Right knee  1993  . Neck surgery      benign tumor removed   . Tubal ligation    . Cataract extraction w/phaco  01/16/2012    Procedure: CATARACT  EXTRACTION PHACO AND INTRAOCULAR LENS PLACEMENT (IOC);  Surgeon: Loraine Leriche T. Nile Riggs, MD;  Location: [facility];  Service: Ophthalmology;  Laterality: Right;  CDE:6.42  . Cataract extraction w/phaco  01/30/2012    Procedure: CATARACT EXTRACTION PHACO AND INTRAOCULAR LENS PLACEMENT (IOC);  Surgeon: Loraine Leriche T. Nile Riggs, MD;  Location: [facility];  Service: Ophthalmology;  Laterality: Left;  CDE:8.36  . Yag laser application Right 08/27/2012    Procedure: YAG LASER APPLICATION;  Surgeon: Loraine Leriche T. Nile Riggs, MD;  Location: [facility];  Service: Ophthalmology;  Laterality: Right;  . Yag laser application Left 09/24/2012    Procedure: YAG LASER APPLICATION;  Surgeon: Loraine Leriche T. Nile Riggs, MD;  Location: [facility];  Service: Ophthalmology;  Laterality: Left;    Family History  Problem Relation Age of Onset  . Cancer Father     lung  . Depression Sister   . Hypertension Sister   . Hyperlipidemia Sister   . Depression Sister   . Heart disease Brother   . COPD Brother   . Cancer Brother     Social history:  reports that she has never smoked. She does not have any smokeless tobacco history on file. She reports that she does not drink alcohol or use illicit drugs.  Medications:  Current Outpatient Prescriptions on File Prior to Visit  Medication Sig Dispense Refill  . aspirin 325 MG tablet Take 325 mg by mouth daily.      Marland Kitchen  gemfibrozil (LOPID) 600 MG tablet Take 600 mg by mouth 2 (two) times daily.      Marland Kitchen omeprazole (PRILOSEC) 20 MG capsule Take 1 capsule (20 mg total) by mouth daily.  90 capsule  1  . promethazine (PHENERGAN) 25 MG tablet Take 25 mg by mouth every 6 (six) hours as needed for nausea (Migraines). For nausea/vomiting      . sertraline (ZOLOFT) 100 MG tablet Take 1 tablet (100 mg total) by mouth daily.  90 tablet  1  . simvastatin (ZOCOR) 40 MG tablet Take 40 mg by mouth daily.      . SUMAtriptan (IMITREX) 100 MG tablet Take 1 tablet (100 mg total) by mouth daily as needed. For migraines  90 tablet  1   No  current facility-administered medications on file prior to visit.     No Known Allergies  ROS:  Out of a complete 14 system review of symptoms, the patient complains only of the following symptoms, and all other reviewed systems are negative.  Moles Right knee swelling, pain, muscle cramps, achy muscles Constipation Easy bruising Migraine headache, numbness Depression, anxiety, insomnia, restless legs  Blood pressure 101/68, pulse 88, height 5' 5.75" (1.67 m), weight 164 lb (74.39 kg).  Physical Exam  General: The patient is alert and cooperative at the time of the examination.  Head: Pupils are equal, round, and reactive to light. Discs are flat bilaterally.  Neck: The neck is supple, no carotid bruits are noted.  Respiratory: The respiratory examination is clear.  Cardiovascular: The cardiovascular examination reveals a regular rate and rhythm, no obvious murmurs or rubs are noted.  Skin: Extremities are without significant edema.  Neurologic Exam  Mental status:  Cranial nerves: Facial symmetry is present. There is good sensation of the face to pinprick and soft touch on the left, and decreased pinprick on the right face. The strength of the facial muscles and the muscles to head turning and shoulder shrug are normal bilaterally. Speech is well enunciated, no aphasia or dysarthria is noted. Extraocular movements are full. Visual fields are full.  Motor: The motor testing reveals 5 over 5 strength of all 4 extremities. Good symmetric motor tone is noted throughout.  Sensory: Sensory testing is intact to pinprick, soft touch, vibration sensation, and position sense on all 4 extremities, with the exception that there is some decrease in vibration and pinprick sensation on the right arm and leg. No evidence of extinction is noted.  Coordination: Cerebellar testing reveals good finger-nose-finger and heel-to-shin bilaterally.  Gait and station: Gait is normal. Tandem gait is  normal. Romberg is negative. No drift is seen.  Reflexes: Deep tendon reflexes are symmetric and normal bilaterally, with the exception that the left ankle jerk reflex is relatively depressed as compared to the right. Toes are downgoing bilaterally.   Assessment/Plan:  1. Migratory paresthesias, all 4 extremities  2. 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 Low back pain  The patient likely has sensory symptoms that are a residual from her cervical spine surgery. Prior MRI study of the cervical spine has shown evidence of spinal cord hyperintensity at the C2-3 level. The patient has some right facial numbness, and the spinal cord abnormality could explain this as well. The patient will be set up for a repeat study of the MRI with and without gadolinium enhancement. The patient will undergo some blood work today. The patient will be placed on Lyrica, and she believes that the sensory paresthesias are uncomfortable for  her. The patient will followup in 4 or 5 months.  Marlan Palau MD 10/30/2012 12:43 PM  [facility] [address] [place], [place] [address]  Phone [phone] Fax [phone]

## 2012-11-05 ENCOUNTER — Ambulatory Visit (HOSPITAL_COMMUNITY)
Admission: RE | Admit: 2012-11-05 | Discharge: 2012-11-05 | Disposition: A | Discharge status: Home / Self Care | Payer: Medicare Other | Source: Ambulatory Visit | Attending: Neurology | Admitting: Neurology

## 2012-11-05 ENCOUNTER — Encounter (HOSPITAL_COMMUNITY): Payer: Self-pay

## 2012-11-05 ENCOUNTER — Telehealth: Payer: Self-pay | Admitting: Neurology

## 2012-11-05 DIAGNOSIS — R209 Unspecified disturbances of skin sensation: Secondary | ICD-10-CM | POA: Insufficient documentation

## 2012-11-05 DIAGNOSIS — M542 Cervicalgia: Secondary | ICD-10-CM | POA: Insufficient documentation

## 2012-11-05 DIAGNOSIS — Z9889 Other specified postprocedural states: Secondary | ICD-10-CM | POA: Insufficient documentation

## 2012-11-05 DIAGNOSIS — G43909 Migraine, unspecified, not intractable, without status migrainosus: Secondary | ICD-10-CM

## 2012-11-05 DIAGNOSIS — R202 Paresthesia of skin: Secondary | ICD-10-CM

## 2012-11-05 LAB — METHYLMALONIC ACID, SERUM: Methylmalonic Acid: 110 nmol/L (ref 0–378)

## 2012-11-05 LAB — COPPER, SERUM: Copper: 134 ug/dL (ref 72–166)

## 2012-11-05 LAB — LYME, TOTAL AB TEST/REFLEX: Lyme IgG/IgM Ab: <0.91 {ISR} (ref 0.00–0.90)

## 2012-11-05 MED ORDER — NORTRIPTYLINE HCL 10 MG PO CAPS: ORAL_CAPSULE | ORAL | Status: DC

## 2012-11-05 MED ORDER — GADOBENATE DIMEGLUMINE 529 MG/ML IV SOLN
15.0000 mL | Freq: Once | INTRAVENOUS | Status: AC | PRN
  Administered 2012-11-05: 15 mL via INTRAVENOUS

## 2012-11-05 NOTE — Telephone Encounter (Signed)
I called the patient. The MRI the cervical spine shows no recurrent tumor, and the C2-3 lesion seen previously has improved. The patient still has tingling in arms, right greater than left and some discomfort with this. The patient could not afford the Lyrica, and gabapentin in the past did not help. I will try the patient on nortriptyline.

## 2012-11-06 NOTE — Progress Notes (Signed)
Quick Note:  Spoke with patient and relayed results of blood work. The patient was also reminded of any future appointments. Patient understood and had no questions.  ______ 

## 2012-12-16 ENCOUNTER — Telehealth: Payer: Self-pay | Admitting: Family Medicine

## 2013-01-31 ENCOUNTER — Encounter: Payer: Self-pay | Admitting: Family Medicine

## 2013-01-31 ENCOUNTER — Telehealth: Payer: Self-pay | Admitting: Family Medicine

## 2013-01-31 MED ORDER — PROMETHAZINE HCL 25 MG PO TABS: 25.0000 mg | ORAL_TABLET | Freq: Four times a day (QID) | ORAL | Status: DC | PRN

## 2013-01-31 MED ORDER — SIMVASTATIN 40 MG PO TABS: 40.0000 mg | ORAL_TABLET | Freq: Every day | ORAL | Status: DC

## 2013-01-31 NOTE — Telephone Encounter (Signed)
Medication refill for one time only.  Patient needs to be seen.  Letter sent for patient to call and schedule 

## 2013-01-31 NOTE — Telephone Encounter (Signed)
Requesting refill of Zocor and Phenergan

## 2013-03-11 ENCOUNTER — Ambulatory Visit: Payer: Medicare Other | Admitting: Nurse Practitioner

## 2013-03-11 ENCOUNTER — Telehealth: Payer: Self-pay | Admitting: Nurse Practitioner

## 2013-03-11 NOTE — Telephone Encounter (Signed)
No show for scheduled appt 

## 2013-05-04 ENCOUNTER — Other Ambulatory Visit: Payer: Self-pay | Admitting: Family Medicine

## 2013-05-06 ENCOUNTER — Encounter: Payer: Self-pay | Admitting: *Deleted

## 2013-05-06 NOTE — Telephone Encounter (Signed)
Medication filled x1 with no refills.   Requires office visit before any further refills can be given.   Letter sent.  

## 2013-05-13 ENCOUNTER — Encounter: Payer: Self-pay | Admitting: *Deleted

## 2013-09-17 ENCOUNTER — Telehealth: Payer: Self-pay | Admitting: Orthopedic Surgery

## 2013-09-18 NOTE — Telephone Encounter (Signed)
Noted.  Called back to patient, and faxed X[facility].  Patient is aware to have it done prior to her scheduled appointment.

## 2013-09-22 ENCOUNTER — Other Ambulatory Visit: Payer: Self-pay | Admitting: Orthopedic Surgery

## 2013-09-22 ENCOUNTER — Ambulatory Visit (HOSPITAL_COMMUNITY)
Admission: RE | Admit: 2013-09-22 | Discharge: 2013-09-22 | Disposition: A | Discharge status: Home / Self Care | Payer: Medicare Other | Source: Ambulatory Visit | Attending: Orthopedic Surgery | Admitting: Orthopedic Surgery

## 2013-09-22 DIAGNOSIS — M25521 Pain in right elbow: Secondary | ICD-10-CM

## 2013-09-22 DIAGNOSIS — M25529 Pain in unspecified elbow: Secondary | ICD-10-CM | POA: Insufficient documentation

## 2013-09-30 ENCOUNTER — Ambulatory Visit (INDEPENDENT_AMBULATORY_CARE_PROVIDER_SITE_OTHER): Payer: Medicare Other | Admitting: Orthopedic Surgery

## 2013-09-30 DIAGNOSIS — M7710 Lateral epicondylitis, unspecified elbow: Secondary | ICD-10-CM

## 2013-09-30 DIAGNOSIS — M7711 Lateral epicondylitis, right elbow: Secondary | ICD-10-CM

## 2013-09-30 NOTE — Patient Instructions (Signed)
Joint Injection Care After Refer to this sheet in the next few days. These instructions provide you with information on caring for yourself after you have had a joint injection. Your caregiver also may give you more specific instructions. Your treatment has been planned according to current medical practices, but problems sometimes occur. Call your caregiver if you have any problems or questions after your procedure. After any type of joint injection, it is not uncommon to experience:  Soreness, swelling, or bruising around the injection site.  Mild numbness, tingling, or weakness around the injection site caused by the numbing medicine used before or with the injection. It also is possible to experience the following effects associated with the specific agent after injection:  Iodine-based contrast agents:  Allergic reaction (itching, hives, widespread redness, and swelling beyond the injection site).  Corticosteroids (These effects are rare.):  Allergic reaction.  Increased blood sugar levels (If you have diabetes and you notice that your blood sugar levels have increased, notify your caregiver).  Increased blood pressure levels.  Mood swings.  Hyaluronic acid in the use of viscosupplementation.  Temporary heat or redness.  Temporary rash and itching.  Increased fluid accumulation in the injected joint. These effects all should resolve within a day after your procedure.  HOME CARE INSTRUCTIONS  Limit yourself to light activity the day of your procedure. Avoid lifting heavy objects, bending, stooping, or twisting.  Take prescription or over-the-counter pain medication as directed by your caregiver.  You may apply ice to your injection site to reduce pain and swelling the day of your procedure. Ice may be applied 03-04 times:  Put ice in a plastic bag.  Place a towel between your skin and the bag.  Leave the ice on for no longer than 15-20 minutes each time. SEEK  IMMEDIATE MEDICAL CARE IF:   Pain and swelling get worse rather than better or extend beyond the injection site.  Numbness does not go away.  Blood or fluid continues to leak from the injection site.  You have chest pain.  You have swelling of your face or tongue.  You have trouble breathing or you become dizzy.  You develop a fever, chills, or severe tenderness at the injection site that last longer than 1 day. MAKE SURE YOU:  Understand these instructions.  Watch your condition.  Get help right away if you are not doing well or if you get worse. Document Released: 11/03/2010 Document Revised: 05/15/2011 Document Reviewed: 11/03/2010 [facility] Patient Information 2015 [employer], [place]. This information is not intended to replace advice given to you by your health care provider. Make sure you discuss any questions you have with your health care provider. Tennis Elbow Your caregiver has diagnosed you with a condition often referred to as "tennis elbow." This results from small tears or soreness (inflammation) at the start (origin) of the extensor muscles of the forearm. Although the condition is often called tennis or golfer's elbow, it is caused by any repetitive action performed by your elbow. HOME CARE INSTRUCTIONS  If the condition has been short lived, rest may be the only treatment required. Using your opposite hand or arm to perform the task may help. Even changing your grip may help rest the extremity. These may even prevent the condition from recurring.  Longer standing problems, however, will often be relieved faster by:  Using anti-inflammatory agents.  Applying ice packs for 30 minutes at the end of the working day, at bed time, or when activities are finished.  Your caregiver may also have you wear a splint or sling. This will allow the inflamed tendon to heal. At times, steroid injections aided with a local anesthetic will be required along with splinting for 1 to 2  weeks. Two to three steroid injections will often solve the problem. In some long standing cases, the inflamed tendon does not respond to conservative (non-surgical) therapy. Then surgery may be required to repair it. MAKE SURE YOU:   Understand these instructions.  Will watch your condition.  Will get help right away if you are not doing well or get worse. Document Released: 02/20/2005 Document Revised: 05/15/2011 Document Reviewed: 10/09/2007 [facility] Patient Information 2015 [place], [place]. This information is not intended to replace advice given to you by your health care provider. Make sure you discuss any questions you have with your health care provider.

## 2013-10-01 ENCOUNTER — Encounter: Payer: Self-pay | Admitting: Orthopedic Surgery

## 2013-10-01 DIAGNOSIS — M7710 Lateral epicondylitis, unspecified elbow: Secondary | ICD-10-CM | POA: Insufficient documentation

## 2013-10-01 NOTE — Progress Notes (Signed)
Patient ID: [place] W [place], female   DOB: 11/29/1955, 58 y.o.   MRN: [id number] Chief complaint right elbow pain  58 year old female presents with atraumatic onset of lateral elbow pain. The pain radiates up into the forearm. She is concerned that she had a tumor in her neck and cause some arm pain several years ago. The pain is exacerbated by wrist extension. It is a dull ache over the lateral epicondyle. There is no associated numbness or tingling there is no weakness although she does have difficulty with power grip maneuvers and picking up small objects  Review of systems is otherwise normal.  The past, family history and social history have been reviewed and are recorded in the corresponding sections of epic    General the patient is well-developed and well-nourished grooming and hygiene are normal Oriented x3 Mood and affect normal Ambulation normal and noncontributory Inspection of the right elbow Tenderness over the lateral epicondyles and also in the brachial radialis and extensor carpi radialis brevis. Full range of motion All joints are stable Motor exam is normal Skin clean dry and intact With the arm in full extension she has painful resisted wrist extension indicating tennis elbow Cardiovascular exam is normal Sensory exam normal  Imaging studies show normal x-rays of the right elbow  Encounter Diagnosis  Name Primary?  . Tennis elbow syndrome, right Yes    Lateral epicondyle injection. (right )  Consent.  Timeout to confirm site.  Right elbow was injected with Depo-Medrol 40 mg and lidocaine 1% 3 cc with sterile technique using alcohol and ethyl chloride prep.  There were no complications

## 2013-10-06 ENCOUNTER — Ambulatory Visit: Payer: Medicare Other | Admitting: Orthopedic Surgery

## 2013-11-03 ENCOUNTER — Telehealth: Payer: Self-pay | Admitting: Neurology

## 2013-11-03 NOTE — Telephone Encounter (Signed)
appt scheduled

## 2013-11-03 NOTE — Telephone Encounter (Signed)
Patient needing appointment before 10/6 due to migraines have worsened.  Please return call anytime and may leave detailed message if not available.

## 2013-11-05 ENCOUNTER — Encounter: Payer: Self-pay | Admitting: Neurology

## 2013-11-05 ENCOUNTER — Ambulatory Visit (INDEPENDENT_AMBULATORY_CARE_PROVIDER_SITE_OTHER): Payer: Medicare Other | Admitting: Neurology

## 2013-11-05 VITALS — BP 127/77 | HR 95 | Wt 161.0 lb

## 2013-11-05 DIAGNOSIS — G43009 Migraine without aura, not intractable, without status migrainosus: Secondary | ICD-10-CM

## 2013-11-05 DIAGNOSIS — R5383 Other fatigue: Secondary | ICD-10-CM

## 2013-11-05 DIAGNOSIS — R5381 Other malaise: Secondary | ICD-10-CM

## 2013-11-05 DIAGNOSIS — R202 Paresthesia of skin: Secondary | ICD-10-CM

## 2013-11-05 DIAGNOSIS — R5382 Chronic fatigue, unspecified: Secondary | ICD-10-CM

## 2013-11-05 DIAGNOSIS — R209 Unspecified disturbances of skin sensation: Secondary | ICD-10-CM

## 2013-11-05 HISTORY — DX: Migraine without aura, not intractable, without status migrainosus: G43.009

## 2013-11-05 MED ORDER — PROPRANOLOL HCL 10 MG PO TABS: ORAL_TABLET | ORAL | Status: DC

## 2013-11-05 NOTE — Progress Notes (Signed)
Reason for visit: Migraine headache  Savannah Rocha is an 57 y.o. female  History of present illness:  Savannah Rocha is a 58 year old right-handed white female right-handed white female with a history of cervical spine surgery previously, possibly secondary to a meningioma that was resected. The patient was last seen through this office for paresthesias of all 4 extremities that was felt to be a residual from that surgery. A MRI the cervical spine was done, but it did not show any abnormalities within the spinal cord. The patient was placed on Lyrica for the dysesthesias, but she could not afford the medication. The patient was placed on nortriptyline, but she apparently stopped the medication at some point. The patient does not recall being on the medication. The patient returns to the office at this point for a different problem. The patient has a lifelong history of migraine headaches, and the headaches usually occur 2 or 3 times a month. Within the last 2 months, the headaches become more frequent, occurring 4 or 5 times a week. The headaches may last up to 2 days at a time. The headaches are bifrontal in nature, and may spread to the occipital area associated with some nausea. The patient does note photophobia and phonophobia with the headache. She indicates that Imitrex will help the headaches some. The patient indicates that bright lights and perfumes may activate the headache. She also has had some difficulty with concentration and slowing of cognitive processing with the headaches. The patient has undergone a recent MRI of the brain that is brought for my review. This shows nonspecific white matter changes that could be consistent with small vessel disease or a history of migraine. The patient also had a repeat MRI of the cervical spine that is unremarkable. No cord lesions are seen. The patient has recently undergone cataract surgery as well. She returns for an evaluation. The patient continues to report paresthesias  on all 4 extremities.  Past Medical History  Diagnosis Date  . Chronic lower back pain   . Depression   . Hyperlipidemia   . GERD (gastroesophageal reflux disease)   . Migraines     last migraine 2 days ago  . Arthritis   . Migraine without aura, without mention of intractable migraine without mention of status migrainosus 11/05/2013    Past Surgical History  Procedure Laterality Date  . Lower back     . Abdominal hysterectomy    . Hemorrhoid surgery    . Right knee  1993  . Neck surgery      benign tumor removed   . Tubal ligation    . Cataract extraction w/phaco  01/16/2012    Procedure: CATARACT EXTRACTION PHACO AND INTRAOCULAR LENS PLACEMENT (IOC);  Surgeon: Loraine Leriche T. Nile Riggs, MD;  Location: [facility];  Service: Ophthalmology;  Laterality: Right;  CDE:6.42  . Cataract extraction w/phaco  01/30/2012    Procedure: CATARACT EXTRACTION PHACO AND INTRAOCULAR LENS PLACEMENT (IOC);  Surgeon: Loraine Leriche T. Nile Riggs, MD;  Location: [facility];  Service: Ophthalmology;  Laterality: Left;  CDE:8.36  . Yag laser application Right 08/27/2012    Procedure: YAG LASER APPLICATION;  Surgeon: Loraine Leriche T. Nile Riggs, MD;  Location: [facility];  Service: Ophthalmology;  Laterality: Right;  . Yag laser application Left 09/24/2012    Procedure: YAG LASER APPLICATION;  Surgeon: Loraine Leriche T. Nile Riggs, MD;  Location: [facility];  Service: Ophthalmology;  Laterality: Left;  . Refractive surgery      Family History  Problem Relation Age of Onset  .  Cancer Father     lung  . Depression Sister   . Hypertension Sister   . Hyperlipidemia Sister   . Depression Sister   . Heart disease Brother   . COPD Brother   . Cancer Brother     Social history:  reports that she has never smoked. She has never used smokeless tobacco. She reports that she does not drink alcohol or use illicit drugs.   No Known Allergies  Medications:  Current Outpatient Prescriptions on File Prior to Visit  Medication Sig Dispense Refill  . aspirin 325 MG tablet  Take 325 mg by mouth daily.      Marland Kitchen gemfibrozil (LOPID) 600 MG tablet Take 600 mg by mouth 2 (two) times daily.      Marland Kitchen HYDROcodone-acetaminophen (NORCO/VICODIN) 5-325 MG per tablet Take 5-325 tablets by mouth as needed.      Marland Kitchen omeprazole (PRILOSEC) 20 MG capsule Take 1 capsule (20 mg total) by mouth daily.  90 capsule  1  . promethazine (PHENERGAN) 25 MG tablet Take 1 tablet (25 mg total) by mouth every 6 (six) hours as needed for nausea (Migraines). For nausea/vomiting  30 tablet  0  . sertraline (ZOLOFT) 100 MG tablet Take 1 tablet (100 mg total) by mouth daily.  90 tablet  1  . simvastatin (ZOCOR) 40 MG tablet Take 1 tablet (40 mg total) by mouth daily.  30 tablet  0  . SUMAtriptan (IMITREX) 100 MG tablet TAKE 1 TABLET EVERY DAY AS NEEDED MIGRAINE EMERGENCY REFILL FAXED DR  30 tablet  0   No current facility-administered medications on file prior to visit.    ROS:  Out of a complete 14 system review of symptoms, the patient complains only of the following symptoms, and all other reviewed systems are negative.  Ringing in the ears Excessive thirst Constipation Frequent waking Joint pain, joint swelling, back pain, achy muscles, neck pain Itching Bruising easily Headache, weakness Depression  Blood pressure 127/77, pulse 95, weight 161 lb (73.029 kg).  Physical Exam  General: The patient is alert and cooperative at the time of the examination.  Neuromuscular: Range of movement the cervical spine lacks about 10 of full lateral rotation bilaterally.  Skin: No significant peripheral edema is noted.   Neurologic Exam  Mental status: The patient is oriented x 3.  Cranial nerves: Facial symmetry is present. Speech is normal, no aphasia or dysarthria is noted. Extraocular movements are full. Visual fields are full.  Motor: The patient has good strength in all 4 extremities.  Sensory examination: Soft touch sensation is decreased on the right face, arm, and leg. Vibration  sensation is also decreased on the right side of the body, and the patient splits the midline with vibration sensation on the forehead, decreased on the right.  Coordination: The patient has good finger-nose-finger and heel-to-shin bilaterally.  Gait and station: The patient has a normal gait. Tandem gait is slightly unsteady. Romberg is negative. No drift is seen.  Reflexes: Deep tendon reflexes are symmetric, with exception that the left ankle jerk reflex is absent, present on the right.    MRI cervical 11/05/12:  IMPRESSION:  1. Postsurgical changes at the C6-7 on the right with no evidence  of tumor recurrence.  2. Cervical spinal cord appears normal. The previously reported  signal abnormalities at C2-3 are not appreciable on this exam.    CT head 09/08/12:  IMPRESSION:  1. No acute intracranial abnormalities.  2. Chronic bilateral basal ganglia lacunar infarcts.  Assessment/Plan:  1. Migraine headache  2. Mildly abnormal MRI brain  3. Sensory paresthesias, all 4 extremities  4. Right hemisensory deficit, nonorganic neurologic examination  The patient does have a nonorganic neurologic examination, splitting the midline of the forehead with vibration sensation. The patient gives a history of migraine headache, and the headache has increased in frequency recently. Previously, the patient indicates that Topamax has not offered much benefit. The patient will be placed on propranolol in low dose. She will contact our office if she is tolerating the medication, but she requires a higher dose. She will followup in about 3 months. The patient may continue to use the Imitrex if needed.  Marlan Palau MD 11/05/2013 8:35 PM  [facility] [address] [place], [place] [address]  Phone [phone] Fax [phone]

## 2013-11-05 NOTE — Patient Instructions (Signed)

## 2013-12-02 ENCOUNTER — Ambulatory Visit: Payer: Medicare Other | Admitting: Orthopedic Surgery

## 2013-12-09 ENCOUNTER — Ambulatory Visit: Payer: Medicare Other | Admitting: Neurology

## 2013-12-26 ENCOUNTER — Other Ambulatory Visit: Payer: Self-pay | Admitting: Family Medicine

## 2013-12-29 NOTE — Telephone Encounter (Signed)
Refill denied.   Requires office visit before any further refills can be given.  

## 2014-02-06 ENCOUNTER — Other Ambulatory Visit: Payer: Self-pay | Admitting: Neurology

## 2014-02-11 ENCOUNTER — Ambulatory Visit: Payer: Medicare Other | Admitting: Neurology

## 2014-03-23 ENCOUNTER — Emergency Department (HOSPITAL_COMMUNITY): Payer: No Typology Code available for payment source

## 2014-03-23 ENCOUNTER — Encounter (HOSPITAL_COMMUNITY): Payer: Self-pay | Admitting: Emergency Medicine

## 2014-03-23 ENCOUNTER — Emergency Department (HOSPITAL_COMMUNITY)
Admission: EM | Admit: 2014-03-23 | Discharge: 2014-03-23 | Disposition: A | Discharge status: Home / Self Care | Payer: No Typology Code available for payment source | Attending: Emergency Medicine | Admitting: Emergency Medicine

## 2014-03-23 DIAGNOSIS — M1990 Unspecified osteoarthritis, unspecified site: Secondary | ICD-10-CM | POA: Diagnosis not present

## 2014-03-23 DIAGNOSIS — Z792 Long term (current) use of antibiotics: Secondary | ICD-10-CM | POA: Diagnosis not present

## 2014-03-23 DIAGNOSIS — Z7982 Long term (current) use of aspirin: Secondary | ICD-10-CM | POA: Insufficient documentation

## 2014-03-23 DIAGNOSIS — Z79899 Other long term (current) drug therapy: Secondary | ICD-10-CM | POA: Diagnosis not present

## 2014-03-23 DIAGNOSIS — Y9389 Activity, other specified: Secondary | ICD-10-CM | POA: Diagnosis not present

## 2014-03-23 DIAGNOSIS — K219 Gastro-esophageal reflux disease without esophagitis: Secondary | ICD-10-CM | POA: Insufficient documentation

## 2014-03-23 DIAGNOSIS — E785 Hyperlipidemia, unspecified: Secondary | ICD-10-CM | POA: Insufficient documentation

## 2014-03-23 DIAGNOSIS — S199XXA Unspecified injury of neck, initial encounter: Secondary | ICD-10-CM | POA: Diagnosis present

## 2014-03-23 DIAGNOSIS — G8929 Other chronic pain: Secondary | ICD-10-CM | POA: Insufficient documentation

## 2014-03-23 DIAGNOSIS — S20211A Contusion of right front wall of thorax, initial encounter: Secondary | ICD-10-CM | POA: Diagnosis not present

## 2014-03-23 DIAGNOSIS — F329 Major depressive disorder, single episode, unspecified: Secondary | ICD-10-CM | POA: Diagnosis not present

## 2014-03-23 DIAGNOSIS — S0990XA Unspecified injury of head, initial encounter: Secondary | ICD-10-CM | POA: Insufficient documentation

## 2014-03-23 DIAGNOSIS — S39012A Strain of muscle, fascia and tendon of lower back, initial encounter: Secondary | ICD-10-CM | POA: Diagnosis not present

## 2014-03-23 DIAGNOSIS — S161XXA Strain of muscle, fascia and tendon at neck level, initial encounter: Secondary | ICD-10-CM | POA: Diagnosis not present

## 2014-03-23 DIAGNOSIS — Y92410 Unspecified street and highway as the place of occurrence of the external cause: Secondary | ICD-10-CM | POA: Diagnosis not present

## 2014-03-23 DIAGNOSIS — Y998 Other external cause status: Secondary | ICD-10-CM | POA: Insufficient documentation

## 2014-03-23 DIAGNOSIS — G43909 Migraine, unspecified, not intractable, without status migrainosus: Secondary | ICD-10-CM | POA: Diagnosis not present

## 2014-03-23 DIAGNOSIS — G44319 Acute post-traumatic headache, not intractable: Secondary | ICD-10-CM

## 2014-03-23 MED ORDER — HYDROCODONE-ACETAMINOPHEN 5-325 MG PO TABS: 1.0000 | ORAL_TABLET | ORAL | Status: DC | PRN

## 2014-03-23 MED ORDER — ONDANSETRON 4 MG PO TBDP
4.0000 mg | ORAL_TABLET | Freq: Once | ORAL | Status: AC
  Administered 2014-03-23: 4 mg via ORAL
  Filled 2014-03-23: qty 1

## 2014-03-23 MED ORDER — OXYCODONE-ACETAMINOPHEN 5-325 MG PO TABS
1.0000 | ORAL_TABLET | Freq: Once | ORAL | Status: AC
  Administered 2014-03-23: 1 via ORAL
  Filled 2014-03-23: qty 1
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
## 2014-03-23 NOTE — ED Notes (Signed)
Removed patient from backboard with 3 assists as verbally ordered by Dr Rubin PayorPickering. Maintained c-spine position during removal of backboard.

## 2014-03-23 NOTE — Discharge Instructions (Signed)
° °Cervical Sprain °A cervical sprain is an injury in the neck in which the strong, fibrous tissues (ligaments) that connect your neck bones stretch or tear. Cervical sprains can range from mild to severe. Severe cervical sprains can cause the neck vertebrae to be unstable. This can lead to damage of the spinal cord and can result in serious nervous system problems. The amount of time it takes for a cervical sprain to get better depends on the cause and extent of the injury. Most cervical sprains heal in 1 to 3 weeks. °CAUSES  °Severe cervical sprains may be caused by:  °· Contact sport injuries (such as from football, rugby, wrestling, hockey, auto racing, gymnastics, diving, martial arts, or boxing).   °· Motor vehicle collisions.   °· Whiplash injuries. This is an injury from a sudden forward and backward whipping movement of the head and neck.  °· Falls.   °Mild cervical sprains may be caused by:  °· Being in an awkward position, such as while cradling a telephone between your ear and shoulder.   °· Sitting in a chair that does not offer proper support.   °· Working at a poorly designed computer station.   °· Looking up or down for long periods of time.   °SYMPTOMS  °· Pain, soreness, stiffness, or a burning sensation in the front, back, or sides of the neck. This discomfort may develop immediately after the injury or slowly, 24 hours or more after the injury.   °· Pain or tenderness directly in the middle of the back of the neck.   °· Shoulder or upper back pain.   °· Limited ability to move the neck.   °· Headache.   °· Dizziness.   °· Weakness, numbness, or tingling in the hands or arms.   °· Muscle spasms.   °· Difficulty swallowing or chewing.   °· Tenderness and swelling of the neck.   °DIAGNOSIS  °Most of the time your health care provider can diagnose a cervical sprain by taking your history and doing a physical exam. Your health care provider will ask about previous neck injuries and any known neck  problems, such as arthritis in the neck. X-rays may be taken to find out if there are any other problems, such as with the bones of the neck. Other tests, such as a CT scan or MRI, may also be needed.  °TREATMENT  °Treatment depends on the severity of the cervical sprain. Mild sprains can be treated with rest, keeping the neck in place (immobilization), and pain medicines. Severe cervical sprains are immediately immobilized. Further treatment is done to help with pain, muscle spasms, and other symptoms and may include: °· Medicines, such as pain relievers, numbing medicines, or muscle relaxants.   °· Physical therapy. This may involve stretching exercises, strengthening exercises, and posture training. Exercises and improved posture can help stabilize the neck, strengthen muscles, and help stop symptoms from returning.   °HOME CARE INSTRUCTIONS  °· Put ice on the injured area.   °· Put ice in a plastic bag.   °· Place a towel between your skin and the bag.   °· Leave the ice on for 15-20 minutes, 3-4 times a day.   °· If your injury was severe, you may have been given a cervical collar to wear. A cervical collar is a two-piece collar designed to keep your neck from moving while it heals. °· Do not remove the collar unless instructed by your health care provider. °· If you have long hair, keep it outside of the collar. °· Ask your health care provider before making any adjustments to your collar.   Minor adjustments may be required over time to improve comfort and reduce pressure on your chin or on the back of your head. °· If you are allowed to remove the collar for cleaning or bathing, follow your health care provider's instructions on how to do so safely. °· Keep your collar clean by wiping it with mild soap and water and drying it completely. If the collar you have been given includes removable pads, remove them every 1-2 days and hand wash them with soap and water. Allow them to air dry. They should be completely  dry before you wear them in the collar. °· If you are allowed to remove the collar for cleaning and bathing, wash and dry the skin of your neck. Check your skin for irritation or sores. If you see any, tell your health care provider. °· Do not drive while wearing the collar.   °· Only take over-the-counter or prescription medicines for pain, discomfort, or fever as directed by your health care provider.   °· Keep all follow-up appointments as directed by your health care provider.   °· Keep all physical therapy appointments as directed by your health care provider.   °· Make any needed adjustments to your workstation to promote good posture.   °· Avoid positions and activities that make your symptoms worse.   °· Warm up and stretch before being active to help prevent problems.   °SEEK MEDICAL CARE IF:  °· Your pain is not controlled with medicine.   °· You are unable to decrease your pain medicine over time as planned.   °· Your activity level is not improving as expected.   °SEEK IMMEDIATE MEDICAL CARE IF:  °· You develop any bleeding. °· You develop stomach upset. °· You have signs of an allergic reaction to your medicine.   °· Your symptoms get worse.   °· You develop new, unexplained symptoms.   °· You have numbness, tingling, weakness, or paralysis in any part of your body.   °MAKE SURE YOU:  °· Understand these instructions. °· Will watch your condition. °· Will get help right away if you are not doing well or get worse. °Document Released: 12/18/2006 Document Revised: 02/25/2013 Document Reviewed: 08/28/2012 °[employer]® Patient Information ©2015 [employer]. This information is not intended to replace advice given to you by your health care provider. Make sure you discuss any questions you have with your health care provider. °Lumbosacral Strain °Lumbosacral strain is a strain of any of the parts that make up your lumbosacral vertebrae. Your lumbosacral vertebrae are the bones that make up the lower third of  your backbone. Your lumbosacral vertebrae are held together by muscles and tough, fibrous tissue (ligaments).  °CAUSES  °A sudden blow to your back can cause lumbosacral strain. Also, anything that causes an excessive stretch of the muscles in the low back can cause this strain. This is typically seen when people exert themselves strenuously, fall, lift heavy objects, bend, or crouch repeatedly. °RISK FACTORS °· Physically demanding work. °· Participation in pushing or pulling sports or sports that require a sudden twist of the back (tennis, golf, baseball). °· Weight lifting. °· Excessive lower back curvature. °· Forward-tilted pelvis. °· Weak back or abdominal muscles or both. °· Tight hamstrings. °SIGNS AND SYMPTOMS  °Lumbosacral strain may cause pain in the area of your injury or pain that moves (radiates) down your leg.  °DIAGNOSIS °Your health care provider can often diagnose lumbosacral strain through a physical exam. In some cases, you may need tests such as X-ray exams.  °TREATMENT  °Treatment for your lower   back injury depends on many factors that your clinician will have to evaluate. However, most treatment will include the use of anti-inflammatory medicines. °HOME CARE INSTRUCTIONS  °· Avoid hard physical activities (tennis, racquetball, waterskiing) if you are not in proper physical condition for it. This may aggravate or create problems. °· If you have a back problem, avoid sports requiring sudden body movements. Swimming and walking are generally safer activities. °· Maintain good posture. °· Maintain a healthy weight. °· For acute conditions, you may put ice on the injured area. °· Put ice in a plastic bag. °· Place a towel between your skin and the bag. °· Leave the ice on for 20 minutes, 2-3 times a day. °· When the low back starts healing, stretching and strengthening exercises may be recommended. °SEEK MEDICAL CARE IF: °· Your back pain is getting worse. °· You experience severe back pain not  relieved with medicines. °SEEK IMMEDIATE MEDICAL CARE IF:  °· You have numbness, tingling, weakness, or problems with the use of your arms or legs. °· There is a change in bowel or bladder control. °· You have increasing pain in any area of the body, including your belly (abdomen). °· You notice shortness of breath, dizziness, or feel faint. °· You feel sick to your stomach (nauseous), are throwing up (vomiting), or become sweaty. °· You notice discoloration of your toes or legs, or your feet get very cold. °MAKE SURE YOU:  °· Understand these instructions. °· Will watch your condition. °· Will get help right away if you are not doing well or get worse. °Document Released: 11/30/2004 Document Revised: 02/25/2013 Document Reviewed: 10/09/2012 °ExitCare® Patient Information ©2015 [employer]. This information is not intended to replace advice given to you by your health care provider. Make sure you discuss any questions you have with your health care provider. °Motor Vehicle Collision °It is common to have multiple bruises and sore muscles after a motor vehicle collision (MVC). These tend to feel worse for the first 24 hours. You may have the most stiffness and soreness over the first several hours. You may also feel worse when you wake up the first morning after your collision. After this point, you will usually begin to improve with each day. The speed of improvement often depends on the severity of the collision, the number of injuries, and the location and nature of these injuries. °HOME CARE INSTRUCTIONS °· Put ice on the injured area. °¨ Put ice in a plastic bag. °¨ Place a towel between your skin and the bag. °¨ Leave the ice on for 15-20 minutes, 3-4 times a day, or as directed by your health care provider. °· Drink enough fluids to keep your urine clear or pale yellow. Do not drink alcohol. °· Take a warm shower or bath once or twice a day. This will increase blood flow to sore muscles. °· You may return to  activities as directed by your caregiver. Be careful when lifting, as this may aggravate neck or back pain. °· Only take over-the-counter or prescription medicines for pain, discomfort, or fever as directed by your caregiver. Do not use aspirin. This may increase bruising and bleeding. °SEEK IMMEDIATE MEDICAL CARE IF: °· You have numbness, tingling, or weakness in the arms or legs. °· You develop severe headaches not relieved with medicine. °· You have severe neck pain, especially tenderness in the middle of the back of your neck. °· You have changes in bowel or bladder control. °· There is increasing pain   in any area of the body. °· You have shortness of breath, light-headedness, dizziness, or fainting. °· You have chest pain. °· You feel sick to your stomach (nauseous), throw up (vomit), or sweat. °· You have increasing abdominal discomfort. °· There is blood in your urine, stool, or vomit. °· You have pain in your shoulder (shoulder strap areas). °· You feel your symptoms are getting worse. °MAKE SURE YOU: °· Understand these instructions. °· Will watch your condition. °· Will get help right away if you are not doing well or get worse. °Document Released: 02/20/2005 Document Revised: 07/07/2013 Document Reviewed: 07/20/2010 °ExitCare® Patient Information ©2015 [employer]. This information is not intended to replace advice given to you by your health care provider. Make sure you discuss any questions you have with your health care provider. ° ° °
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
## 2014-06-08 ENCOUNTER — Other Ambulatory Visit: Payer: Self-pay | Admitting: Neurology

## 2014-07-06 ENCOUNTER — Other Ambulatory Visit: Payer: Self-pay | Admitting: Neurology

## 2014-07-14 ENCOUNTER — Ambulatory Visit (HOSPITAL_COMMUNITY)
Admission: RE | Admit: 2014-07-14 | Discharge: 2014-07-14 | Disposition: A | Discharge status: Home / Self Care | Payer: No Typology Code available for payment source | Source: Ambulatory Visit | Attending: Orthopedic Surgery | Admitting: Orthopedic Surgery

## 2014-07-14 ENCOUNTER — Encounter: Payer: Self-pay | Admitting: Orthopedic Surgery

## 2014-07-14 ENCOUNTER — Other Ambulatory Visit: Payer: Self-pay | Admitting: Orthopedic Surgery

## 2014-07-14 ENCOUNTER — Ambulatory Visit (INDEPENDENT_AMBULATORY_CARE_PROVIDER_SITE_OTHER): Payer: Medicare Other | Admitting: Orthopedic Surgery

## 2014-07-14 VITALS — BP 90/56 | Ht 65.0 in | Wt 159.6 lb

## 2014-07-14 DIAGNOSIS — S43432A Superior glenoid labrum lesion of left shoulder, initial encounter: Secondary | ICD-10-CM

## 2014-07-14 DIAGNOSIS — M25512 Pain in left shoulder: Secondary | ICD-10-CM | POA: Insufficient documentation
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
## 2014-07-14 NOTE — Patient Instructions (Signed)
We will schedule MRI w/ Arthrogram for you and call you with results  Joint Injection Care After Refer to this sheet in the next few days. These instructions provide you with information on caring for yourself after you have had a joint injection. Your caregiver also may give you more specific instructions. Your treatment has been planned according to current medical practices, but problems sometimes occur. Call your caregiver if you have any problems or questions after your procedure. After any type of joint injection, it is not uncommon to experience:  Soreness, swelling, or bruising around the injection site.  Mild numbness, tingling, or weakness around the injection site caused by the numbing medicine used before or with the injection. It also is possible to experience the following effects associated with the specific agent after injection:  Iodine-based contrast agents:  Allergic reaction (itching, hives, widespread redness, and swelling beyond the injection site).  Corticosteroids (These effects are rare.):  Allergic reaction.  Increased blood sugar levels (If you have diabetes and you notice that your blood sugar levels have increased, notify your caregiver).  Increased blood pressure levels.  Mood swings.  Hyaluronic acid in the use of viscosupplementation.  Temporary heat or redness.  Temporary rash and itching.  Increased fluid accumulation in the injected joint. These effects all should resolve within a day after your procedure.  HOME CARE INSTRUCTIONS  Limit yourself to light activity the day of your procedure. Avoid lifting heavy objects, bending, stooping, or twisting.  Take prescription or over-the-counter pain medication as directed by your caregiver.  You may apply ice to your injection site to reduce pain and swelling the day of your procedure. Ice may be applied 03-04 times:  Put ice in a plastic bag.  Place a towel between your skin and the  bag.  Leave the ice on for no longer than 15-20 minutes each time. SEEK IMMEDIATE MEDICAL CARE IF:   Pain and swelling get worse rather than better or extend beyond the injection site.  Numbness does not go away.  Blood or fluid continues to leak from the injection site.  You have chest pain.  You have swelling of your face or tongue.  You have trouble breathing or you become dizzy.  You develop a fever, chills, or severe tenderness at the injection site that last longer than 1 day. MAKE SURE YOU:  Understand these instructions.  Watch your condition.  Get help right away if you are not doing well or if you get worse. Document Released: 11/03/2010 Document Revised: 05/15/2011 Document Reviewed: 11/03/2010 [facility] Patient Information 2015 [employer]. This information is not intended to replace advice given to you by your health care provider. Make sure you discuss any questions you have with your health care provider.

## 2014-07-31 ENCOUNTER — Other Ambulatory Visit (HOSPITAL_COMMUNITY): Payer: Medicare Other

## 2014-08-01 ENCOUNTER — Other Ambulatory Visit (HOSPITAL_COMMUNITY): Payer: Medicare Other

## 2014-08-05 ENCOUNTER — Other Ambulatory Visit: Payer: Self-pay | Admitting: Neurology

## 2014-08-07 ENCOUNTER — Other Ambulatory Visit (HOSPITAL_COMMUNITY): Payer: Medicare Other

## 2014-08-10 ENCOUNTER — Ambulatory Visit (HOSPITAL_COMMUNITY)
Admission: RE | Admit: 2014-08-10 | Discharge: 2014-08-10 | Disposition: A | Discharge status: Home / Self Care | Payer: Medicare Other | Source: Ambulatory Visit | Attending: Orthopedic Surgery | Admitting: Orthopedic Surgery

## 2014-08-10 ENCOUNTER — Encounter (HOSPITAL_COMMUNITY): Payer: Self-pay

## 2014-08-10 ENCOUNTER — Telehealth: Payer: Self-pay | Admitting: Orthopedic Surgery

## 2014-08-10 ENCOUNTER — Other Ambulatory Visit: Payer: Self-pay | Admitting: Orthopedic Surgery

## 2014-08-10 ENCOUNTER — Other Ambulatory Visit: Payer: Self-pay | Admitting: *Deleted

## 2014-08-10 DIAGNOSIS — M25512 Pain in left shoulder: Secondary | ICD-10-CM | POA: Diagnosis present

## 2014-08-10 DIAGNOSIS — S46012A Strain of muscle(s) and tendon(s) of the rotator cuff of left shoulder, initial encounter: Secondary | ICD-10-CM | POA: Insufficient documentation

## 2014-08-10 DIAGNOSIS — S43432A Superior glenoid labrum lesion of left shoulder, initial encounter: Secondary | ICD-10-CM

## 2014-08-10 DIAGNOSIS — M778 Other enthesopathies, not elsewhere classified: Secondary | ICD-10-CM | POA: Diagnosis not present

## 2014-08-10 MED ORDER — GADOBENATE DIMEGLUMINE 529 MG/ML IV SOLN
5.0000 mL | Freq: Once | INTRAVENOUS | Status: AC | PRN
  Administered 2014-08-10: 5 mL via INTRAVENOUS

## 2014-08-10 MED ORDER — LIDOCAINE HCL (PF) 2 % IJ SOLN
INTRAMUSCULAR | Status: AC
  Filled 2014-08-10: qty 10

## 2014-08-10 MED ORDER — IOHEXOL 300 MG/ML  SOLN
50.0000 mL | Freq: Once | INTRAMUSCULAR | Status: AC | PRN
  Administered 2014-08-10: 50 mL via INTRAVENOUS

## 2014-08-10 NOTE — Telephone Encounter (Signed)
[facility], per Faith, with question on how the orders for MRI / Arthrogram were put in; patient is there now. Ph# [phone]

## 2014-08-10 NOTE — Telephone Encounter (Signed)
Corrected order faxed

## 2014-08-12 ENCOUNTER — Other Ambulatory Visit: Payer: Self-pay | Admitting: *Deleted

## 2014-08-12 ENCOUNTER — Telehealth: Payer: Self-pay | Admitting: Orthopedic Surgery

## 2014-08-12 NOTE — Telephone Encounter (Signed)
DISCUSSED SURGERY operative and nonoperative treatments were discussed. I do not feel that she will get better without surgery. Her tear is likely to progress.  I would do a straightforward open repair small incision and be done within 1 hour. Will use ArthroCare anchors speed screw should be sufficient.  FINDINGS: Rotator cuff: Severe tendinosis of the supraspinatus tendon with a small interstitial tear new and a full-thickness tear of the anterior supraspinatus tendon measuring approximately 8 mm in AP diameter. Moderate tendinosis of the infraspinatus tendon. Teres minor tendon is intact. Subscapularis tendon is intact.   Muscles: No atrophy or fatty replacement of nor abnormal signal within, the muscles of the rotator cuff.   Biceps long head: Intact.   Acromioclavicular Joint: Moderate degenerative change of the acromioclavicular joint. Small amount of contrast in the subacromial/subdeltoid bursa. Type I acromion.   Glenohumeral Joint: No chondral defect. No dislocation. Intraarticular contrast is present.   Labrum: Intact.   Bones: No focal marrow signal abnormality. No fracture or dislocation.

## 2014-08-13 ENCOUNTER — Telehealth: Payer: Self-pay | Admitting: Orthopedic Surgery

## 2014-08-13 NOTE — Telephone Encounter (Signed)
Regarding out-patient (presumed out-patient) [facility], CPT codes planned: 23412,23410 - contacted insurers, (#1) Blue [employer] - Per Ailene Ravel T, no pre-authorization required.  Reference # is Patient's Member ID [id number], and today's date 08/13/14,9:08a.m.  Per insurer (#2) Medicaid - online website, [employer] - no pre-authorization required.

## 2014-08-17 ENCOUNTER — Other Ambulatory Visit: Payer: Self-pay | Admitting: *Deleted

## 2014-08-17 NOTE — Patient Instructions (Addendum)
[place] W [place]  08/17/2014    Your procedure is scheduled on 08/21/14.  Report to [facility] at 10:45 A.M.  Call this number if you have problems the morning of surgery:  [phone]   Remember:  Do not eat food or drink liquids after midnight.  Take these medicines the morning of surgery with A SIP OF WATER:voltaren, omeprazole, zoloft, propranolol, klonopin if needed, and phenergan if needed.   Do not wear jewelry, make-up or nail polish.  Do not wear lotions, powders, or perfumes.  You may wear deodorant.  Do not shave 48 hours prior to surgery.  Men may shave face and neck.  Do not bring valuables to the hospital.  [facility] is not responsible for any belongings or valuables.  Contacts, dentures or bridgework may not be worn into surgery.  Leave your suitcase in the car.  After surgery it may be brought to your room.  For patients admitted to the hospital, discharge time will be determined by your treatment team.  Patients discharged the day of surgery will not be allowed to drive home.   Special instructions:  Shower using Hibiclens (CHG bath) the night before surgery and the morning of surgery.  Please read over the following fact sheets that you were given. Anesthesia Post-op Instructions    Surgery for Rotator Cuff Tear Rotator cuff surgery is only recommended for individuals who have experienced persistent disability for greater than 3 months of non-surgical (conservative) treatment. Surgery is not necessary but is recommended for individuals who experience difficulty completing daily activities or athletes who are unable to compete. Rotator cuff tears do not usually heal without surgical intervention. If left alone small rotator cuff tears usually become larger. Younger athletes who have a rotator cuff tear may be recommended for surgery without attempting conservative rehabilitation. The purpose of surgery is to regain function of the shoulder joint and eliminate  pain associated with the injury. In addition to repairing the tendon tear, the surgery often removes a portion of the bony roof of the shoulder (acromion) as well as the chronically thickened and inflamed membrane below the acromion (subacromial bursa). REASONS NOT TO OPERATE   Infection of the shoulder.  Inability to complete a rehabilitation program.  Patients who have other conditions (emotional or psychological) conditions that contribute to their shoulder condition. RISKS AND COMPLICATIONS  Infection.  Re-tear of the rotator cuff tendons or muscles.  Shoulder stiffness and/or weakness.  Inability to compete in athletics.  Acromioclavicular (AC) joint paint.  Risks of surgery: infection, bleeding, nerve damage, or damage to surrounding tissues. TECHNIQUE There are different surgical procedures used to treat rotator cuff tears. The type of procedure depends on the extent of injury as well as the surgeon's preference. All of the surgical techniques for rotator cuff tears have the same goal of repairing the torn tendon, removing part of the acromion, and removing the subacromial bursa. There are two main types of procedures: arthroscopic and open incision. Arthroscopic procedures are usually completed and you go home the same day as surgery (outpatient). These procedures use multiple small incisions in which tools and a video camera are placed to work on the shoulder. An electric shaver removes the bursa, then a power burr shaves down the portion of the acromion that places pressure on the rotator cuff. Finally the rotator cuff is sewed (sutured) back to the humeral head. Open incision procedures require a larger incision. The deltoid muscle is detached from the acromion and a ligament  in the shoulder (coracoacromial) is cut in order for the surgeon to access the rotator cuff. The subacromial bursa is removed as well as part of the acromion to give the rotator cuff room to move freely. The  torn tendon is then sutured to the humeral head. After the rotator cuff is repaired, then the deltoid is reattached and the incision is closed up.  RECOVERY   Post-operative care depends on the surgical technique and the preferences of your therapist.  Keep the wound clean and dry for the first 10 to 14 days after surgery.  Keep your shoulder and arm in the sling provided to you for as long as you have been instructed to.  You will be given pain medications by your caregiver.  Passive (without using muscles) shoulder movements may be begun immediately after surgery.  It is important to follow through with you rehabilitation program in order to have the best possible recovery. RETURN TO SPORTS   The rehabilitation period will depend on the sport and position you play as well as the success of the operation.  The minimum recovery period is 6 months.  You must have regained complete shoulder motion and strength before returning to sports. SEEK IMMEDIATE MEDICAL CARE IF:   Any medications produce adverse side effects.  Any complications from surgery occur:  Pain, numbness, or coldness in the extremity operated upon.  Discoloration of the nail beds (they become blue or gray) of the extremity operated upon.  Signs of infections (fever, pain, inflammation, redness, or persistent bleeding)    PATIENT INSTRUCTIONS POST-ANESTHESIA  IMMEDIATELY FOLLOWING SURGERY:  Do not drive or operate machinery for the first twenty four hours after surgery.  Do not make any important decisions for twenty four hours after surgery or while taking narcotic pain medications or sedatives.  If you develop intractable nausea and vomiting or a severe headache please notify your doctor immediately.  FOLLOW-UP:  Please make an appointment with your surgeon as instructed. You do not need to follow up with anesthesia unless specifically instructed to do so.  WOUND CARE INSTRUCTIONS (if applicable):  Keep a dry  clean dressing on the anesthesia/puncture wound site if there is drainage.  Once the wound has quit draining you may leave it open to air.  Generally you should leave the bandage intact for twenty four hours unless there is drainage.  If the epidural site drains for more than 36-48 hours please call the anesthesia department.  QUESTIONS?:  Please feel free to call your physician or the hospital operator if you have any questions, and they will be happy to assist you.

## 2014-08-17 NOTE — Telephone Encounter (Signed)
Per chart note, patient is cancelling surgery for 08/21/14. Notified [employer] via voice message at [phone].

## 2014-08-18 ENCOUNTER — Encounter (HOSPITAL_COMMUNITY)
Admission: RE | Admit: 2014-08-18 | Discharge: 2014-08-18 | Disposition: A | Discharge status: Home / Self Care | Payer: Medicare Other | Source: Ambulatory Visit | Attending: Orthopedic Surgery | Admitting: Orthopedic Surgery

## 2014-08-21 ENCOUNTER — Encounter (HOSPITAL_COMMUNITY): Admission: RE | Payer: Self-pay | Source: Ambulatory Visit

## 2014-08-21 ENCOUNTER — Ambulatory Visit (HOSPITAL_COMMUNITY): Admission: RE | Admit: 2014-08-21 | Payer: Medicare Other | Source: Ambulatory Visit | Admitting: Orthopedic Surgery

## 2014-08-21 SURGERY — REPAIR, ROTATOR CUFF, OPEN
Anesthesia: Choice | Laterality: Left

## 2014-08-24 ENCOUNTER — Ambulatory Visit: Payer: Medicare Other | Admitting: Orthopedic Surgery

## 2014-09-10 ENCOUNTER — Encounter: Payer: Self-pay | Admitting: Nurse Practitioner

## 2014-09-10 ENCOUNTER — Ambulatory Visit (INDEPENDENT_AMBULATORY_CARE_PROVIDER_SITE_OTHER): Payer: Medicare Other | Admitting: Nurse Practitioner

## 2014-09-10 VITALS — BP 91/60 | HR 68 | Temp 98.2°F | Ht 65.0 in | Wt 162.6 lb

## 2014-09-10 DIAGNOSIS — G43009 Migraine without aura, not intractable, without status migrainosus: Secondary | ICD-10-CM | POA: Diagnosis not present

## 2014-09-10 DIAGNOSIS — R112 Nausea with vomiting, unspecified: Secondary | ICD-10-CM

## 2014-09-10 DIAGNOSIS — G43909 Migraine, unspecified, not intractable, without status migrainosus: Secondary | ICD-10-CM | POA: Diagnosis not present

## 2014-09-10 MED ORDER — PROMETHAZINE HCL 25 MG RE SUPP: 25.0000 mg | Freq: Four times a day (QID) | RECTAL | Status: AC | PRN

## 2014-09-10 MED ORDER — PROPRANOLOL HCL 10 MG PO TABS: ORAL_TABLET | ORAL | Status: DC

## 2014-09-10 MED ORDER — SUMATRIPTAN SUCCINATE 100 MG PO TABS: ORAL_TABLET | ORAL | Status: DC

## 2014-09-10 NOTE — Progress Notes (Signed)
I have read the note, and I agree with the clinical assessment and plan.  Aliscia Clayton KEITH   

## 2014-09-10 NOTE — Progress Notes (Signed)
[facility]  PATIENT: [place] DOB: 05/15/1955   REASON FOR VISIT: Follow-up for headache and medication refill  HISTORY FROM: Patient    HISTORY OF PRESENT ILLNESS: Ms Lin GivensJeffries, 59 year old female returns for follow-up. She has had nausea and vomiting for a couple of days and now has a headache probably due to her inability to keep anything down. She has only had a few sips Sprite. She also reports chills .She has also has diarrhea. She has been unable to keep her Phenergan by mouth down for nausea. She is currently on Inderal as a headache preventative and Imitrex acutely. She took her Imitrex yesterday and her headache went away but returned when she continued to have vomiting diarrhea. She is not running a fever. She had labs drawn at her primary care office yesterday. She returns for reevaluation. She reports that her husband died recently from cancer on June 20. She's also had problems with her left shoulder and was due to have surgery but that has been postponed.    HISTORY:( KW)Ms. Lin GivensJeffries is a 59 year old right-handed white female with a history of cervical spine surgery previously, possibly secondary to a meningioma that was resected. The patient was last seen through this office for paresthesias of all 4 extremities that was felt to be a residual from that surgery. A MRI the cervical spine was done, but it did not show any abnormalities within the spinal cord. The patient was placed on Lyrica for the dysesthesias, but she could not afford the medication. The patient was placed on nortriptyline, but she apparently stopped the medication at some point. The patient does not recall being on the medication. The patient returns to the office at this point for a different problem. The patient has a lifelong history of migraine headaches, and the headaches usually occur 2 or 3 times a month. Within the last 2 months, the headaches become more frequent, occurring 4  or 5 times a week. The headaches may last up to 2 days at a time. The headaches are bifrontal in nature, and may spread to the occipital area associated with some nausea. The patient does note photophobia and phonophobia with the headache. She indicates that Imitrex will help the headaches some. The patient indicates that bright lights and perfumes may activate the headache. She also has had some difficulty with concentration and slowing of cognitive processing with the headaches. The patient has undergone a recent MRI of the brain that is brought for my review. This shows nonspecific white matter changes that could be consistent with small vessel disease or a history of migraine. The patient also had a repeat MRI of the cervical spine that is unremarkable. No cord lesions are seen. The patient has recently undergone cataract surgery as well. She returns for an evaluation. The patient continues to report paresthesias on all 4 extremities.   REVIEW OF SYSTEMS: Full 14 system review of systems performed and notable only for those listed, all others are neg:  Constitutional: Chills Cardiovascular: Leg swelling Ear/Nose/Throat: neg  Skin: neg Eyes: neg Respiratory: neg Gastroitestinal: Nausea vomiting diarrhea Hematology/Lymphatic: neg  Endocrine: neg Musculoskeletal: Joint pain Allergy/Immunology: neg Neurological: Headache Psychiatric: neg Sleep : neg   ALLERGIES: No Known Allergies  HOME MEDICATIONS: Outpatient Prescriptions Prior to Visit  Medication Sig Dispense Refill  . aspirin 325 MG tablet Take 325 mg by mouth daily.    . baclofen (LIORESAL) 10 MG tablet Take 10 mg by mouth at bedtime.   3  .  calcium carbonate (OS-CAL) 600 MG TABS tablet Take 600 mg by mouth.    . clonazePAM (KLONOPIN) 0.5 MG tablet Take 0.5 mg by mouth 2 (two) times daily.    . diclofenac (VOLTAREN) 75 MG EC tablet Take 75 mg by mouth 2 (two) times daily.    Marland Kitchen gemfibrozil (LOPID) 600 MG tablet Take 600 mg by  mouth 2 (two) times daily.    Marland Kitchen HYDROcodone-acetaminophen (NORCO/VICODIN) 5-325 MG per tablet Take 1 tablet by mouth every 4 (four) hours as needed. 15 tablet 0  . mometasone (ELOCON) 0.1 % cream Apply 1 application topically as needed.    Marland Kitchen omeprazole (PRILOSEC) 20 MG capsule Take 1 capsule (20 mg total) by mouth daily. 90 capsule 1  . promethazine (PHENERGAN) 25 MG tablet Take 1 tablet (25 mg total) by mouth every 6 (six) hours as needed for nausea (Migraines). For nausea/vomiting 30 tablet 0  . propranolol (INDERAL) 10 MG tablet TAKE 1 TABLET BY MOUTH TWICE DAILY FOR 1 WEEK, THEN 2 TABLETS BY MOUTH TWICE DAILY 60 tablet 0  . sertraline (ZOLOFT) 100 MG tablet Take 1 tablet (100 mg total) by mouth daily. 90 tablet 1  . simvastatin (ZOCOR) 40 MG tablet Take 1 tablet (40 mg total) by mouth daily. 30 tablet 0  . SUMAtriptan (IMITREX) 100 MG tablet TAKE 1 TABLET EVERY DAY AS NEEDED MIGRAINE EMERGENCY REFILL FAXED DR 30 tablet 0   No facility-administered medications prior to visit.    PAST MEDICAL HISTORY: Past Medical History  Diagnosis Date  . Chronic lower back pain   . Depression   . Hyperlipidemia   . GERD (gastroesophageal reflux disease)   . Migraines     last migraine 2 days ago  . Arthritis   . Migraine without aura, without mention of intractable migraine without mention of status migrainosus 11/05/2013    PAST SURGICAL HISTORY: Past Surgical History  Procedure Laterality Date  . Lower back     . Abdominal hysterectomy    . Hemorrhoid surgery    . Right knee  1993  . Neck surgery      benign tumor removed   . Tubal ligation    . Cataract extraction w/phaco  01/16/2012    Procedure: CATARACT EXTRACTION PHACO AND INTRAOCULAR LENS PLACEMENT (IOC);  Surgeon: Loraine Leriche T. Nile Riggs, MD;  Location: [facility];  Service: Ophthalmology;  Laterality: Right;  CDE:6.42  . Cataract extraction w/phaco  01/30/2012    Procedure: CATARACT EXTRACTION PHACO AND INTRAOCULAR LENS PLACEMENT (IOC);   Surgeon: Loraine Leriche T. Nile Riggs, MD;  Location: [facility];  Service: Ophthalmology;  Laterality: Left;  CDE:8.36  . Yag laser application Right 08/27/2012    Procedure: YAG LASER APPLICATION;  Surgeon: Loraine Leriche T. Nile Riggs, MD;  Location: [facility];  Service: Ophthalmology;  Laterality: Right;  . Yag laser application Left 09/24/2012    Procedure: YAG LASER APPLICATION;  Surgeon: Loraine Leriche T. Nile Riggs, MD;  Location: [facility];  Service: Ophthalmology;  Laterality: Left;  . Refractive surgery      FAMILY HISTORY: Family History  Problem Relation Age of Onset  . Cancer Father     lung  . Depression Sister   . Hypertension Sister   . Hyperlipidemia Sister   . Depression Sister   . Heart disease Brother   . COPD Brother   . Cancer Brother     SOCIAL HISTORY: History   Social History  . Marital Status: Legally Separated    Spouse Name: N/A  . Number of Children: 3  .  Years of Education: 11   Occupational History  . Not on file.   Social History Main Topics  . Smoking status: Never Smoker   . Smokeless tobacco: Never Used  . Alcohol Use: No  . Drug Use: No  . Sexual Activity: Not on file   Other Topics Concern  . Not on file   Social History Narrative   Pts husband passed away with cancer this last week.  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  Generalized: Well developed, in no acute distress  Head: normocephalic and atraumatic,. Oropharynx benign  Neck: Supple, no carotid bruits  Cardiac: Regular rate rhythm, no murmur  Musculoskeletal: No deformity   Neurological examination   Mentation: Alert oriented to time, place, history taking. Attention span and concentration appropriate. Recent and remote memory intact.  Follows all commands speech and language fluent.   Cranial nerve II-XII: Pupils  were equal round reactive to light extraocular movements were full, visual field were full on confrontational test. Facial sensation and strength were normal. hearing was intact to finger rubbing bilaterally. Uvula tongue midline. head turning and shoulder shrug were normal and symmetric.Tongue protrusion into cheek strength was normal. Motor: normal bulk and tone, full strength in the BUE, BLE, fine finger movements normal, no pronator drift. No focal weakness Sensory: normal and symmetric to light touch, pinprick, and  Vibration,   Coordination: finger-nose-finger, heel-to-shin bilaterally, no dysmetria Reflexes: Brachioradialis 2/2, biceps 2/2, triceps 2/2, patellar 2/2, Achilles 2/2, plantar responses were flexor bilaterally. Gait and Station: Rising up from seated position without assistance, normal stance,  moderate stride, good arm swing, smooth turning, able to perform tiptoe, and heel walking without difficulty. Tandem gait is mildly unsteady  DIAGNOSTIC DATA (LABS, IMAGING, TESTING) -   ASSESSMENT AND PLAN  59 y.o. year old female  has a past medical history of migraine headache, mildly abnormal MRI of the past, recent headache most likely due to  nausea vomiting and diarrhea for several days and patient is dehydrated. She has only been able to keep sips of liquid down   Continue Inderal will refill, this is her preventive medication Continue Imitrex, will refill Increase fluids today, I think you have a virus Phenergan 25 mg suppository when necessary nausea and vomiting Nilda Riggs, [facility], APRN  [facility] [address] [place], [place] [address] [phone]

## 2014-09-10 NOTE — Patient Instructions (Signed)
Continue Inderal will refill, this is her preventive medication Continue Imitrex, will refill Increase fluids today, I think you have a virus Phenergan 25 mg suppository when necessary nausea and vomiting

## 2014-09-21 ENCOUNTER — Telehealth: Payer: Self-pay | Admitting: Orthopedic Surgery

## 2014-09-21 NOTE — Telephone Encounter (Signed)
Patient is asking to reschedule her Rotator Repair Surgery, she had to cancel due to her husband passing away, please advise?

## 2014-09-21 NOTE — Telephone Encounter (Signed)
It's okay to reschedule  I would do a straightforward open repair small incision and be done within 1 hour. Will use [employer] anchors speed screw should be sufficient.  FINDINGS: Rotator cuff: Severe tendinosis of the supraspinatus tendon with a small interstitial tear new and a full-thickness tear of the anterior supraspinatus tendon measuring approximately 8 mm in AP diameter. Moderate tendinosis of the infraspinatus tendon. Teres minor tendon is intact. Subscapularis tendon is intact.  August 17 or August 24

## 2014-09-21 NOTE — Telephone Encounter (Signed)
Opened in error

## 2014-09-21 NOTE — Telephone Encounter (Signed)
Ok to proceed or does she need follow up in office first?

## 2014-09-25 NOTE — Telephone Encounter (Signed)
CALLED PATIENT, NO ANSWER, LEFT VM 

## 2014-09-30 NOTE — Telephone Encounter (Signed)
Attempted to contact patient regarding rescheduling surgery, home phone busy signal only and mobile disconnected

## 2014-10-19 NOTE — Telephone Encounter (Signed)
Patient has appointment with pcp 8/19 to get clearance for surgery, we will follow up once clearance is recieved

## 2014-11-03 ENCOUNTER — Other Ambulatory Visit: Payer: Self-pay | Admitting: Nurse Practitioner

## 2014-11-30 ENCOUNTER — Telehealth: Payer: Self-pay | Admitting: Nurse Practitioner

## 2014-11-30 NOTE — Telephone Encounter (Signed)
[employer] [phone] called to advise Promethazine non formulary exception request has been approved. Letter to follow.

## 2014-11-30 NOTE — Telephone Encounter (Addendum)
LMVM for pt to return call regarding about approval drug.

## 2014-12-01 NOTE — Telephone Encounter (Signed)
LMVM for pt that received call about promethazine exception request was approved.  If she has questions to let me know.

## 2015-03-09 DIAGNOSIS — Z6826 Body mass index (BMI) 26.0-26.9, adult: Secondary | ICD-10-CM | POA: Diagnosis not present

## 2015-03-09 DIAGNOSIS — J069 Acute upper respiratory infection, unspecified: Secondary | ICD-10-CM | POA: Diagnosis not present

## 2015-03-09 DIAGNOSIS — R350 Frequency of micturition: Secondary | ICD-10-CM | POA: Diagnosis not present

## 2015-03-09 DIAGNOSIS — Z418 Encounter for other procedures for purposes other than remedying health state: Secondary | ICD-10-CM | POA: Diagnosis not present

## 2015-04-01 DIAGNOSIS — E7800 Pure hypercholesterolemia, unspecified: Secondary | ICD-10-CM | POA: Diagnosis not present

## 2015-04-01 DIAGNOSIS — M1990 Unspecified osteoarthritis, unspecified site: Secondary | ICD-10-CM | POA: Diagnosis not present

## 2015-04-01 DIAGNOSIS — M549 Dorsalgia, unspecified: Secondary | ICD-10-CM | POA: Diagnosis not present

## 2015-04-01 DIAGNOSIS — Z789 Other specified health status: Secondary | ICD-10-CM | POA: Diagnosis not present

## 2015-04-07 ENCOUNTER — Other Ambulatory Visit: Payer: Self-pay | Admitting: Nurse Practitioner

## 2015-04-21 DIAGNOSIS — R110 Nausea: Secondary | ICD-10-CM | POA: Diagnosis not present

## 2015-04-21 DIAGNOSIS — G43909 Migraine, unspecified, not intractable, without status migrainosus: Secondary | ICD-10-CM | POA: Diagnosis not present

## 2015-05-11 DIAGNOSIS — R251 Tremor, unspecified: Secondary | ICD-10-CM | POA: Diagnosis not present

## 2015-05-11 DIAGNOSIS — R0789 Other chest pain: Secondary | ICD-10-CM | POA: Diagnosis not present

## 2015-05-11 DIAGNOSIS — I6789 Other cerebrovascular disease: Secondary | ICD-10-CM | POA: Diagnosis not present

## 2015-05-11 DIAGNOSIS — R531 Weakness: Secondary | ICD-10-CM | POA: Diagnosis not present

## 2015-05-11 DIAGNOSIS — R200 Anesthesia of skin: Secondary | ICD-10-CM | POA: Diagnosis not present

## 2015-05-17 DIAGNOSIS — J069 Acute upper respiratory infection, unspecified: Secondary | ICD-10-CM | POA: Diagnosis not present

## 2015-05-17 DIAGNOSIS — R51 Headache: Secondary | ICD-10-CM | POA: Diagnosis not present

## 2015-05-17 DIAGNOSIS — R05 Cough: Secondary | ICD-10-CM | POA: Diagnosis not present

## 2015-06-17 DIAGNOSIS — E7800 Pure hypercholesterolemia, unspecified: Secondary | ICD-10-CM | POA: Diagnosis not present

## 2015-06-17 DIAGNOSIS — F329 Major depressive disorder, single episode, unspecified: Secondary | ICD-10-CM | POA: Diagnosis not present

## 2015-06-17 DIAGNOSIS — R143 Flatulence: Secondary | ICD-10-CM | POA: Diagnosis not present

## 2015-06-21 DIAGNOSIS — R143 Flatulence: Secondary | ICD-10-CM | POA: Diagnosis not present

## 2015-06-22 DIAGNOSIS — Z1231 Encounter for screening mammogram for malignant neoplasm of breast: Secondary | ICD-10-CM | POA: Diagnosis not present

## 2015-06-29 DIAGNOSIS — Z1379 Encounter for other screening for genetic and chromosomal anomalies: Secondary | ICD-10-CM | POA: Diagnosis not present

## 2015-06-29 DIAGNOSIS — Z803 Family history of malignant neoplasm of breast: Secondary | ICD-10-CM | POA: Diagnosis not present

## 2015-06-29 DIAGNOSIS — Z315 Encounter for genetic counseling: Secondary | ICD-10-CM | POA: Diagnosis not present

## 2015-07-30 DIAGNOSIS — Z803 Family history of malignant neoplasm of breast: Secondary | ICD-10-CM | POA: Diagnosis not present

## 2015-08-16 DIAGNOSIS — D649 Anemia, unspecified: Secondary | ICD-10-CM | POA: Diagnosis not present

## 2015-08-16 DIAGNOSIS — R5383 Other fatigue: Secondary | ICD-10-CM | POA: Diagnosis not present

## 2015-08-16 DIAGNOSIS — G629 Polyneuropathy, unspecified: Secondary | ICD-10-CM | POA: Diagnosis not present

## 2015-08-16 DIAGNOSIS — R42 Dizziness and giddiness: Secondary | ICD-10-CM | POA: Diagnosis not present

## 2015-08-17 DIAGNOSIS — Z1272 Encounter for screening for malignant neoplasm of vagina: Secondary | ICD-10-CM | POA: Diagnosis not present

## 2015-08-17 DIAGNOSIS — Z01419 Encounter for gynecological examination (general) (routine) without abnormal findings: Secondary | ICD-10-CM | POA: Diagnosis not present

## 2015-08-17 DIAGNOSIS — Z6826 Body mass index (BMI) 26.0-26.9, adult: Secondary | ICD-10-CM | POA: Diagnosis not present

## 2015-08-26 DIAGNOSIS — M549 Dorsalgia, unspecified: Secondary | ICD-10-CM | POA: Diagnosis not present

## 2015-08-26 DIAGNOSIS — S93422A Sprain of deltoid ligament of left ankle, initial encounter: Secondary | ICD-10-CM | POA: Diagnosis not present

## 2015-08-26 DIAGNOSIS — Z1389 Encounter for screening for other disorder: Secondary | ICD-10-CM | POA: Diagnosis not present

## 2015-08-26 DIAGNOSIS — F329 Major depressive disorder, single episode, unspecified: Secondary | ICD-10-CM | POA: Diagnosis not present

## 2015-09-29 DIAGNOSIS — R110 Nausea: Secondary | ICD-10-CM | POA: Diagnosis not present

## 2015-09-29 DIAGNOSIS — M25512 Pain in left shoulder: Secondary | ICD-10-CM | POA: Diagnosis not present

## 2015-09-29 DIAGNOSIS — Z299 Encounter for prophylactic measures, unspecified: Secondary | ICD-10-CM | POA: Diagnosis not present

## 2015-10-20 ENCOUNTER — Encounter: Payer: Self-pay | Admitting: Orthopedic Surgery

## 2015-10-20 ENCOUNTER — Ambulatory Visit (INDEPENDENT_AMBULATORY_CARE_PROVIDER_SITE_OTHER): Payer: PPO

## 2015-10-20 ENCOUNTER — Ambulatory Visit (INDEPENDENT_AMBULATORY_CARE_PROVIDER_SITE_OTHER): Payer: PPO | Admitting: Orthopedic Surgery

## 2015-10-20 VITALS — BP 121/76 | HR 66 | Ht 65.0 in | Wt 161.4 lb

## 2015-10-20 DIAGNOSIS — M542 Cervicalgia: Secondary | ICD-10-CM | POA: Diagnosis not present

## 2015-10-20 MED ORDER — PREDNISONE 10 MG PO TABS: 10.0000 mg | ORAL_TABLET | Freq: Every day | ORAL | 0 refills | Status: DC
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
## 2015-10-21 ENCOUNTER — Telehealth: Payer: Self-pay | Admitting: Orthopedic Surgery

## 2015-10-21 NOTE — Telephone Encounter (Signed)
[facility]  [facility]  [phone]

## 2015-10-21 NOTE — Telephone Encounter (Signed)
Clarification given. 

## 2015-10-29 ENCOUNTER — Ambulatory Visit: Payer: PPO | Admitting: Orthopedic Surgery

## 2015-10-29 ENCOUNTER — Other Ambulatory Visit: Payer: Self-pay | Admitting: Nurse Practitioner

## 2015-11-05 DIAGNOSIS — E559 Vitamin D deficiency, unspecified: Secondary | ICD-10-CM | POA: Diagnosis not present

## 2015-11-05 DIAGNOSIS — Z1211 Encounter for screening for malignant neoplasm of colon: Secondary | ICD-10-CM | POA: Diagnosis not present

## 2015-11-05 DIAGNOSIS — M542 Cervicalgia: Secondary | ICD-10-CM | POA: Diagnosis not present

## 2015-11-05 DIAGNOSIS — R5383 Other fatigue: Secondary | ICD-10-CM | POA: Diagnosis not present

## 2015-11-05 DIAGNOSIS — R350 Frequency of micturition: Secondary | ICD-10-CM | POA: Diagnosis not present

## 2015-11-05 DIAGNOSIS — M549 Dorsalgia, unspecified: Secondary | ICD-10-CM | POA: Diagnosis not present

## 2015-11-05 DIAGNOSIS — Z79899 Other long term (current) drug therapy: Secondary | ICD-10-CM | POA: Diagnosis not present

## 2015-11-05 DIAGNOSIS — Z299 Encounter for prophylactic measures, unspecified: Secondary | ICD-10-CM | POA: Diagnosis not present

## 2015-11-05 DIAGNOSIS — Z7189 Other specified counseling: Secondary | ICD-10-CM | POA: Diagnosis not present

## 2015-11-05 DIAGNOSIS — Z0000 Encounter for general adult medical examination without abnormal findings: Secondary | ICD-10-CM | POA: Diagnosis not present

## 2015-11-05 DIAGNOSIS — Z1389 Encounter for screening for other disorder: Secondary | ICD-10-CM | POA: Diagnosis not present

## 2015-11-05 DIAGNOSIS — E7800 Pure hypercholesterolemia, unspecified: Secondary | ICD-10-CM | POA: Diagnosis not present

## 2015-11-10 ENCOUNTER — Ambulatory Visit: Payer: PPO | Admitting: Orthopedic Surgery

## 2015-11-29 ENCOUNTER — Other Ambulatory Visit: Payer: Self-pay | Admitting: Neurology

## 2015-12-10 DIAGNOSIS — G43909 Migraine, unspecified, not intractable, without status migrainosus: Secondary | ICD-10-CM | POA: Diagnosis not present

## 2015-12-10 DIAGNOSIS — E7800 Pure hypercholesterolemia, unspecified: Secondary | ICD-10-CM | POA: Diagnosis not present

## 2015-12-10 DIAGNOSIS — M549 Dorsalgia, unspecified: Secondary | ICD-10-CM | POA: Diagnosis not present

## 2015-12-10 DIAGNOSIS — M542 Cervicalgia: Secondary | ICD-10-CM | POA: Diagnosis not present

## 2015-12-10 DIAGNOSIS — E2839 Other primary ovarian failure: Secondary | ICD-10-CM | POA: Diagnosis not present

## 2015-12-10 DIAGNOSIS — Z6826 Body mass index (BMI) 26.0-26.9, adult: Secondary | ICD-10-CM | POA: Diagnosis not present

## 2015-12-15 DIAGNOSIS — Z9889 Other specified postprocedural states: Secondary | ICD-10-CM | POA: Diagnosis not present

## 2015-12-15 DIAGNOSIS — M542 Cervicalgia: Secondary | ICD-10-CM | POA: Diagnosis not present

## 2015-12-15 DIAGNOSIS — M8938 Hypertrophy of bone, other site: Secondary | ICD-10-CM | POA: Diagnosis not present

## 2016-01-17 DIAGNOSIS — J069 Acute upper respiratory infection, unspecified: Secondary | ICD-10-CM | POA: Diagnosis not present

## 2016-01-17 DIAGNOSIS — Z713 Dietary counseling and surveillance: Secondary | ICD-10-CM | POA: Diagnosis not present

## 2016-01-17 DIAGNOSIS — Z299 Encounter for prophylactic measures, unspecified: Secondary | ICD-10-CM | POA: Diagnosis not present

## 2016-01-17 DIAGNOSIS — Z789 Other specified health status: Secondary | ICD-10-CM | POA: Diagnosis not present

## 2016-01-17 DIAGNOSIS — Z6826 Body mass index (BMI) 26.0-26.9, adult: Secondary | ICD-10-CM | POA: Diagnosis not present

## 2016-03-02 DIAGNOSIS — R42 Dizziness and giddiness: Secondary | ICD-10-CM | POA: Diagnosis not present

## 2016-03-02 DIAGNOSIS — E7800 Pure hypercholesterolemia, unspecified: Secondary | ICD-10-CM | POA: Diagnosis not present

## 2016-03-02 DIAGNOSIS — Z6826 Body mass index (BMI) 26.0-26.9, adult: Secondary | ICD-10-CM | POA: Diagnosis not present

## 2016-03-02 DIAGNOSIS — Z299 Encounter for prophylactic measures, unspecified: Secondary | ICD-10-CM | POA: Diagnosis not present

## 2016-03-02 DIAGNOSIS — F329 Major depressive disorder, single episode, unspecified: Secondary | ICD-10-CM | POA: Diagnosis not present

## 2016-05-31 DIAGNOSIS — Z789 Other specified health status: Secondary | ICD-10-CM | POA: Diagnosis not present

## 2016-05-31 DIAGNOSIS — F329 Major depressive disorder, single episode, unspecified: Secondary | ICD-10-CM | POA: Diagnosis not present

## 2016-05-31 DIAGNOSIS — R350 Frequency of micturition: Secondary | ICD-10-CM | POA: Diagnosis not present

## 2016-05-31 DIAGNOSIS — R42 Dizziness and giddiness: Secondary | ICD-10-CM | POA: Diagnosis not present

## 2016-05-31 DIAGNOSIS — E7800 Pure hypercholesterolemia, unspecified: Secondary | ICD-10-CM | POA: Diagnosis not present

## 2016-05-31 DIAGNOSIS — Z6826 Body mass index (BMI) 26.0-26.9, adult: Secondary | ICD-10-CM | POA: Diagnosis not present

## 2016-05-31 DIAGNOSIS — K210 Gastro-esophageal reflux disease with esophagitis: Secondary | ICD-10-CM | POA: Diagnosis not present

## 2016-05-31 DIAGNOSIS — N390 Urinary tract infection, site not specified: Secondary | ICD-10-CM | POA: Diagnosis not present

## 2016-05-31 DIAGNOSIS — Z299 Encounter for prophylactic measures, unspecified: Secondary | ICD-10-CM | POA: Diagnosis not present

## 2016-06-12 DIAGNOSIS — R9089 Other abnormal findings on diagnostic imaging of central nervous system: Secondary | ICD-10-CM | POA: Diagnosis not present

## 2016-06-12 DIAGNOSIS — R42 Dizziness and giddiness: Secondary | ICD-10-CM | POA: Diagnosis not present

## 2016-06-12 DIAGNOSIS — R51 Headache: Secondary | ICD-10-CM | POA: Diagnosis not present

## 2016-06-12 DIAGNOSIS — R110 Nausea: Secondary | ICD-10-CM | POA: Diagnosis not present

## 2016-06-14 DIAGNOSIS — Z713 Dietary counseling and surveillance: Secondary | ICD-10-CM | POA: Diagnosis not present

## 2016-06-14 DIAGNOSIS — K210 Gastro-esophageal reflux disease with esophagitis: Secondary | ICD-10-CM | POA: Diagnosis not present

## 2016-06-14 DIAGNOSIS — R42 Dizziness and giddiness: Secondary | ICD-10-CM | POA: Diagnosis not present

## 2016-06-14 DIAGNOSIS — F329 Major depressive disorder, single episode, unspecified: Secondary | ICD-10-CM | POA: Diagnosis not present

## 2016-06-14 DIAGNOSIS — E7800 Pure hypercholesterolemia, unspecified: Secondary | ICD-10-CM | POA: Diagnosis not present

## 2016-06-14 DIAGNOSIS — Z6827 Body mass index (BMI) 27.0-27.9, adult: Secondary | ICD-10-CM | POA: Diagnosis not present

## 2016-06-14 DIAGNOSIS — R0602 Shortness of breath: Secondary | ICD-10-CM | POA: Diagnosis not present

## 2016-06-14 DIAGNOSIS — Z299 Encounter for prophylactic measures, unspecified: Secondary | ICD-10-CM | POA: Diagnosis not present

## 2016-06-14 DIAGNOSIS — Z789 Other specified health status: Secondary | ICD-10-CM | POA: Diagnosis not present

## 2016-06-14 DIAGNOSIS — K5900 Constipation, unspecified: Secondary | ICD-10-CM | POA: Diagnosis not present

## 2016-06-29 DIAGNOSIS — Z1231 Encounter for screening mammogram for malignant neoplasm of breast: Secondary | ICD-10-CM | POA: Diagnosis not present

## 2016-07-03 DIAGNOSIS — Z6827 Body mass index (BMI) 27.0-27.9, adult: Secondary | ICD-10-CM | POA: Diagnosis not present

## 2016-07-03 DIAGNOSIS — K5900 Constipation, unspecified: Secondary | ICD-10-CM | POA: Diagnosis not present

## 2016-07-03 DIAGNOSIS — Z299 Encounter for prophylactic measures, unspecified: Secondary | ICD-10-CM | POA: Diagnosis not present

## 2016-07-03 DIAGNOSIS — E7800 Pure hypercholesterolemia, unspecified: Secondary | ICD-10-CM | POA: Diagnosis not present

## 2016-07-03 DIAGNOSIS — K210 Gastro-esophageal reflux disease with esophagitis: Secondary | ICD-10-CM | POA: Diagnosis not present

## 2016-07-03 DIAGNOSIS — G43909 Migraine, unspecified, not intractable, without status migrainosus: Secondary | ICD-10-CM | POA: Diagnosis not present

## 2016-07-03 DIAGNOSIS — M19012 Primary osteoarthritis, left shoulder: Secondary | ICD-10-CM | POA: Diagnosis not present

## 2016-07-03 DIAGNOSIS — G629 Polyneuropathy, unspecified: Secondary | ICD-10-CM | POA: Diagnosis not present

## 2016-07-03 DIAGNOSIS — R42 Dizziness and giddiness: Secondary | ICD-10-CM | POA: Diagnosis not present

## 2016-07-06 DIAGNOSIS — R42 Dizziness and giddiness: Secondary | ICD-10-CM | POA: Diagnosis not present

## 2016-07-06 DIAGNOSIS — G459 Transient cerebral ischemic attack, unspecified: Secondary | ICD-10-CM | POA: Diagnosis not present

## 2016-07-06 DIAGNOSIS — R51 Headache: Secondary | ICD-10-CM | POA: Diagnosis not present

## 2016-08-03 DIAGNOSIS — F329 Major depressive disorder, single episode, unspecified: Secondary | ICD-10-CM | POA: Diagnosis not present

## 2016-08-03 DIAGNOSIS — Z299 Encounter for prophylactic measures, unspecified: Secondary | ICD-10-CM | POA: Diagnosis not present

## 2016-08-03 DIAGNOSIS — K210 Gastro-esophageal reflux disease with esophagitis: Secondary | ICD-10-CM | POA: Diagnosis not present

## 2016-08-03 DIAGNOSIS — F419 Anxiety disorder, unspecified: Secondary | ICD-10-CM | POA: Diagnosis not present

## 2016-08-03 DIAGNOSIS — E7800 Pure hypercholesterolemia, unspecified: Secondary | ICD-10-CM | POA: Diagnosis not present

## 2016-08-03 DIAGNOSIS — Z6826 Body mass index (BMI) 26.0-26.9, adult: Secondary | ICD-10-CM | POA: Diagnosis not present

## 2016-08-03 DIAGNOSIS — Z713 Dietary counseling and surveillance: Secondary | ICD-10-CM | POA: Diagnosis not present

## 2016-08-03 DIAGNOSIS — G629 Polyneuropathy, unspecified: Secondary | ICD-10-CM | POA: Diagnosis not present

## 2016-08-08 DIAGNOSIS — F329 Major depressive disorder, single episode, unspecified: Secondary | ICD-10-CM | POA: Diagnosis not present

## 2016-08-08 DIAGNOSIS — Z299 Encounter for prophylactic measures, unspecified: Secondary | ICD-10-CM | POA: Diagnosis not present

## 2016-08-08 DIAGNOSIS — E7800 Pure hypercholesterolemia, unspecified: Secondary | ICD-10-CM | POA: Diagnosis not present

## 2016-08-08 DIAGNOSIS — Z6826 Body mass index (BMI) 26.0-26.9, adult: Secondary | ICD-10-CM | POA: Diagnosis not present

## 2016-08-08 DIAGNOSIS — K210 Gastro-esophageal reflux disease with esophagitis: Secondary | ICD-10-CM | POA: Diagnosis not present

## 2016-08-08 DIAGNOSIS — Z789 Other specified health status: Secondary | ICD-10-CM | POA: Diagnosis not present

## 2016-08-08 DIAGNOSIS — J069 Acute upper respiratory infection, unspecified: Secondary | ICD-10-CM | POA: Diagnosis not present

## 2016-10-02 DIAGNOSIS — Z789 Other specified health status: Secondary | ICD-10-CM | POA: Diagnosis not present

## 2016-10-02 DIAGNOSIS — Z299 Encounter for prophylactic measures, unspecified: Secondary | ICD-10-CM | POA: Diagnosis not present

## 2016-10-02 DIAGNOSIS — G629 Polyneuropathy, unspecified: Secondary | ICD-10-CM | POA: Diagnosis not present

## 2016-10-02 DIAGNOSIS — J069 Acute upper respiratory infection, unspecified: Secondary | ICD-10-CM | POA: Diagnosis not present

## 2016-10-02 DIAGNOSIS — E7800 Pure hypercholesterolemia, unspecified: Secondary | ICD-10-CM | POA: Diagnosis not present

## 2016-10-02 DIAGNOSIS — Z6826 Body mass index (BMI) 26.0-26.9, adult: Secondary | ICD-10-CM | POA: Diagnosis not present

## 2016-11-14 DIAGNOSIS — F419 Anxiety disorder, unspecified: Secondary | ICD-10-CM | POA: Diagnosis not present

## 2016-11-14 DIAGNOSIS — R5383 Other fatigue: Secondary | ICD-10-CM | POA: Diagnosis not present

## 2016-11-14 DIAGNOSIS — Z299 Encounter for prophylactic measures, unspecified: Secondary | ICD-10-CM | POA: Diagnosis not present

## 2016-11-14 DIAGNOSIS — Z1389 Encounter for screening for other disorder: Secondary | ICD-10-CM | POA: Diagnosis not present

## 2016-11-14 DIAGNOSIS — Z1211 Encounter for screening for malignant neoplasm of colon: Secondary | ICD-10-CM | POA: Diagnosis not present

## 2016-11-14 DIAGNOSIS — F329 Major depressive disorder, single episode, unspecified: Secondary | ICD-10-CM | POA: Diagnosis not present

## 2016-11-14 DIAGNOSIS — Z7189 Other specified counseling: Secondary | ICD-10-CM | POA: Diagnosis not present

## 2016-11-14 DIAGNOSIS — E559 Vitamin D deficiency, unspecified: Secondary | ICD-10-CM | POA: Diagnosis not present

## 2016-11-14 DIAGNOSIS — M25571 Pain in right ankle and joints of right foot: Secondary | ICD-10-CM | POA: Diagnosis not present

## 2016-11-14 DIAGNOSIS — E7800 Pure hypercholesterolemia, unspecified: Secondary | ICD-10-CM | POA: Diagnosis not present

## 2016-11-14 DIAGNOSIS — Z79899 Other long term (current) drug therapy: Secondary | ICD-10-CM | POA: Diagnosis not present

## 2016-11-14 DIAGNOSIS — Z0000 Encounter for general adult medical examination without abnormal findings: Secondary | ICD-10-CM | POA: Diagnosis not present

## 2016-11-14 DIAGNOSIS — G43909 Migraine, unspecified, not intractable, without status migrainosus: Secondary | ICD-10-CM | POA: Diagnosis not present

## 2016-11-14 DIAGNOSIS — M25572 Pain in left ankle and joints of left foot: Secondary | ICD-10-CM | POA: Diagnosis not present

## 2016-11-14 DIAGNOSIS — Z6825 Body mass index (BMI) 25.0-25.9, adult: Secondary | ICD-10-CM | POA: Diagnosis not present

## 2016-12-29 DIAGNOSIS — J069 Acute upper respiratory infection, unspecified: Secondary | ICD-10-CM | POA: Diagnosis not present

## 2016-12-29 DIAGNOSIS — Z789 Other specified health status: Secondary | ICD-10-CM | POA: Diagnosis not present

## 2017-03-26 DIAGNOSIS — Z6827 Body mass index (BMI) 27.0-27.9, adult: Secondary | ICD-10-CM | POA: Diagnosis not present

## 2017-03-26 DIAGNOSIS — Z713 Dietary counseling and surveillance: Secondary | ICD-10-CM | POA: Diagnosis not present

## 2017-03-26 DIAGNOSIS — Z789 Other specified health status: Secondary | ICD-10-CM | POA: Diagnosis not present

## 2017-03-26 DIAGNOSIS — J069 Acute upper respiratory infection, unspecified: Secondary | ICD-10-CM | POA: Diagnosis not present

## 2017-03-26 DIAGNOSIS — Z299 Encounter for prophylactic measures, unspecified: Secondary | ICD-10-CM | POA: Diagnosis not present

## 2017-03-29 DIAGNOSIS — H43393 Other vitreous opacities, bilateral: Secondary | ICD-10-CM | POA: Diagnosis not present

## 2017-04-24 DIAGNOSIS — Z6827 Body mass index (BMI) 27.0-27.9, adult: Secondary | ICD-10-CM | POA: Diagnosis not present

## 2017-04-24 DIAGNOSIS — E7800 Pure hypercholesterolemia, unspecified: Secondary | ICD-10-CM | POA: Diagnosis not present

## 2017-04-24 DIAGNOSIS — F329 Major depressive disorder, single episode, unspecified: Secondary | ICD-10-CM | POA: Diagnosis not present

## 2017-04-24 DIAGNOSIS — Z299 Encounter for prophylactic measures, unspecified: Secondary | ICD-10-CM | POA: Diagnosis not present

## 2017-04-24 DIAGNOSIS — J069 Acute upper respiratory infection, unspecified: Secondary | ICD-10-CM | POA: Diagnosis not present

## 2017-06-01 DIAGNOSIS — Z6827 Body mass index (BMI) 27.0-27.9, adult: Secondary | ICD-10-CM | POA: Diagnosis not present

## 2017-06-01 DIAGNOSIS — Z299 Encounter for prophylactic measures, unspecified: Secondary | ICD-10-CM | POA: Diagnosis not present

## 2017-06-01 DIAGNOSIS — E7800 Pure hypercholesterolemia, unspecified: Secondary | ICD-10-CM | POA: Diagnosis not present

## 2017-06-01 DIAGNOSIS — H8309 Labyrinthitis, unspecified ear: Secondary | ICD-10-CM | POA: Diagnosis not present

## 2017-06-01 DIAGNOSIS — Z713 Dietary counseling and surveillance: Secondary | ICD-10-CM | POA: Diagnosis not present

## 2017-06-27 DIAGNOSIS — H903 Sensorineural hearing loss, bilateral: Secondary | ICD-10-CM | POA: Diagnosis not present

## 2017-06-27 DIAGNOSIS — H9113 Presbycusis, bilateral: Secondary | ICD-10-CM | POA: Diagnosis not present

## 2017-06-27 DIAGNOSIS — H6983 Other specified disorders of Eustachian tube, bilateral: Secondary | ICD-10-CM | POA: Diagnosis not present

## 2017-07-05 DIAGNOSIS — Z299 Encounter for prophylactic measures, unspecified: Secondary | ICD-10-CM | POA: Diagnosis not present

## 2017-07-05 DIAGNOSIS — E7800 Pure hypercholesterolemia, unspecified: Secondary | ICD-10-CM | POA: Diagnosis not present

## 2017-07-05 DIAGNOSIS — R51 Headache: Secondary | ICD-10-CM | POA: Diagnosis not present

## 2017-07-05 DIAGNOSIS — I6782 Cerebral ischemia: Secondary | ICD-10-CM | POA: Diagnosis not present

## 2017-07-05 DIAGNOSIS — Z6827 Body mass index (BMI) 27.0-27.9, adult: Secondary | ICD-10-CM | POA: Diagnosis not present

## 2017-07-05 DIAGNOSIS — R42 Dizziness and giddiness: Secondary | ICD-10-CM | POA: Diagnosis not present

## 2017-07-26 DIAGNOSIS — Z1231 Encounter for screening mammogram for malignant neoplasm of breast: Secondary | ICD-10-CM | POA: Diagnosis not present

## 2017-10-15 DIAGNOSIS — Z6827 Body mass index (BMI) 27.0-27.9, adult: Secondary | ICD-10-CM | POA: Diagnosis not present

## 2017-10-15 DIAGNOSIS — F329 Major depressive disorder, single episode, unspecified: Secondary | ICD-10-CM | POA: Diagnosis not present

## 2017-10-15 DIAGNOSIS — Z713 Dietary counseling and surveillance: Secondary | ICD-10-CM | POA: Diagnosis not present

## 2017-10-15 DIAGNOSIS — Z299 Encounter for prophylactic measures, unspecified: Secondary | ICD-10-CM | POA: Diagnosis not present

## 2017-10-15 DIAGNOSIS — M25512 Pain in left shoulder: Secondary | ICD-10-CM | POA: Diagnosis not present

## 2017-10-15 DIAGNOSIS — M19012 Primary osteoarthritis, left shoulder: Secondary | ICD-10-CM | POA: Diagnosis not present

## 2017-11-19 DIAGNOSIS — Z299 Encounter for prophylactic measures, unspecified: Secondary | ICD-10-CM | POA: Diagnosis not present

## 2017-11-19 DIAGNOSIS — Z1339 Encounter for screening examination for other mental health and behavioral disorders: Secondary | ICD-10-CM | POA: Diagnosis not present

## 2017-11-19 DIAGNOSIS — E559 Vitamin D deficiency, unspecified: Secondary | ICD-10-CM | POA: Diagnosis not present

## 2017-11-19 DIAGNOSIS — Z7189 Other specified counseling: Secondary | ICD-10-CM | POA: Diagnosis not present

## 2017-11-19 DIAGNOSIS — Z79899 Other long term (current) drug therapy: Secondary | ICD-10-CM | POA: Diagnosis not present

## 2017-11-19 DIAGNOSIS — Z1211 Encounter for screening for malignant neoplasm of colon: Secondary | ICD-10-CM | POA: Diagnosis not present

## 2017-11-19 DIAGNOSIS — R5383 Other fatigue: Secondary | ICD-10-CM | POA: Diagnosis not present

## 2017-11-19 DIAGNOSIS — Z6827 Body mass index (BMI) 27.0-27.9, adult: Secondary | ICD-10-CM | POA: Diagnosis not present

## 2017-11-19 DIAGNOSIS — E7800 Pure hypercholesterolemia, unspecified: Secondary | ICD-10-CM | POA: Diagnosis not present

## 2017-11-19 DIAGNOSIS — F419 Anxiety disorder, unspecified: Secondary | ICD-10-CM | POA: Diagnosis not present

## 2017-11-19 DIAGNOSIS — Z1331 Encounter for screening for depression: Secondary | ICD-10-CM | POA: Diagnosis not present

## 2017-11-19 DIAGNOSIS — R350 Frequency of micturition: Secondary | ICD-10-CM | POA: Diagnosis not present

## 2017-11-19 DIAGNOSIS — Z0000 Encounter for general adult medical examination without abnormal findings: Secondary | ICD-10-CM | POA: Diagnosis not present

## 2017-12-17 DIAGNOSIS — R1011 Right upper quadrant pain: Secondary | ICD-10-CM | POA: Diagnosis not present

## 2017-12-17 DIAGNOSIS — Z713 Dietary counseling and surveillance: Secondary | ICD-10-CM | POA: Diagnosis not present

## 2017-12-17 DIAGNOSIS — Z6828 Body mass index (BMI) 28.0-28.9, adult: Secondary | ICD-10-CM | POA: Diagnosis not present

## 2017-12-17 DIAGNOSIS — Z299 Encounter for prophylactic measures, unspecified: Secondary | ICD-10-CM | POA: Diagnosis not present

## 2017-12-17 DIAGNOSIS — E7800 Pure hypercholesterolemia, unspecified: Secondary | ICD-10-CM | POA: Diagnosis not present

## 2017-12-21 DIAGNOSIS — R1011 Right upper quadrant pain: Secondary | ICD-10-CM | POA: Diagnosis not present

## 2018-01-09 DIAGNOSIS — E2839 Other primary ovarian failure: Secondary | ICD-10-CM | POA: Diagnosis not present

## 2018-01-22 DIAGNOSIS — M2550 Pain in unspecified joint: Secondary | ICD-10-CM | POA: Diagnosis not present

## 2018-01-22 DIAGNOSIS — Z6828 Body mass index (BMI) 28.0-28.9, adult: Secondary | ICD-10-CM | POA: Diagnosis not present

## 2018-01-22 DIAGNOSIS — Z299 Encounter for prophylactic measures, unspecified: Secondary | ICD-10-CM | POA: Diagnosis not present

## 2018-01-22 DIAGNOSIS — F329 Major depressive disorder, single episode, unspecified: Secondary | ICD-10-CM | POA: Diagnosis not present

## 2018-01-22 DIAGNOSIS — M797 Fibromyalgia: Secondary | ICD-10-CM | POA: Diagnosis not present

## 2018-01-22 DIAGNOSIS — M7552 Bursitis of left shoulder: Secondary | ICD-10-CM | POA: Diagnosis not present

## 2018-01-25 DIAGNOSIS — E7800 Pure hypercholesterolemia, unspecified: Secondary | ICD-10-CM | POA: Diagnosis not present

## 2018-01-25 DIAGNOSIS — F329 Major depressive disorder, single episode, unspecified: Secondary | ICD-10-CM | POA: Diagnosis not present

## 2018-01-25 DIAGNOSIS — Z299 Encounter for prophylactic measures, unspecified: Secondary | ICD-10-CM | POA: Diagnosis not present

## 2018-01-25 DIAGNOSIS — Z6828 Body mass index (BMI) 28.0-28.9, adult: Secondary | ICD-10-CM | POA: Diagnosis not present

## 2018-01-25 DIAGNOSIS — M79603 Pain in arm, unspecified: Secondary | ICD-10-CM | POA: Diagnosis not present

## 2018-04-26 DIAGNOSIS — M75102 Unspecified rotator cuff tear or rupture of left shoulder, not specified as traumatic: Secondary | ICD-10-CM | POA: Diagnosis not present

## 2018-04-26 DIAGNOSIS — M25512 Pain in left shoulder: Secondary | ICD-10-CM | POA: Diagnosis not present

## 2018-05-06 DIAGNOSIS — M25512 Pain in left shoulder: Secondary | ICD-10-CM | POA: Diagnosis not present

## 2018-05-10 DIAGNOSIS — M75102 Unspecified rotator cuff tear or rupture of left shoulder, not specified as traumatic: Secondary | ICD-10-CM | POA: Diagnosis not present

## 2018-05-10 DIAGNOSIS — M13812 Other specified arthritis, left shoulder: Secondary | ICD-10-CM | POA: Diagnosis not present

## 2018-05-10 DIAGNOSIS — M25512 Pain in left shoulder: Secondary | ICD-10-CM | POA: Diagnosis not present

## 2018-10-13 DIAGNOSIS — Z1159 Encounter for screening for other viral diseases: Secondary | ICD-10-CM | POA: Diagnosis not present

## 2018-11-28 DIAGNOSIS — Z1211 Encounter for screening for malignant neoplasm of colon: Secondary | ICD-10-CM | POA: Diagnosis not present

## 2018-11-28 DIAGNOSIS — D649 Anemia, unspecified: Secondary | ICD-10-CM | POA: Diagnosis not present

## 2018-11-28 DIAGNOSIS — E559 Vitamin D deficiency, unspecified: Secondary | ICD-10-CM | POA: Diagnosis not present

## 2018-11-28 DIAGNOSIS — Z0000 Encounter for general adult medical examination without abnormal findings: Secondary | ICD-10-CM | POA: Diagnosis not present

## 2018-11-28 DIAGNOSIS — Z299 Encounter for prophylactic measures, unspecified: Secondary | ICD-10-CM | POA: Diagnosis not present

## 2018-11-28 DIAGNOSIS — Z7189 Other specified counseling: Secondary | ICD-10-CM | POA: Diagnosis not present

## 2018-11-28 DIAGNOSIS — Z6828 Body mass index (BMI) 28.0-28.9, adult: Secondary | ICD-10-CM | POA: Diagnosis not present

## 2018-11-28 DIAGNOSIS — Z79899 Other long term (current) drug therapy: Secondary | ICD-10-CM | POA: Diagnosis not present

## 2018-11-28 DIAGNOSIS — Z1331 Encounter for screening for depression: Secondary | ICD-10-CM | POA: Diagnosis not present

## 2018-11-28 DIAGNOSIS — F329 Major depressive disorder, single episode, unspecified: Secondary | ICD-10-CM | POA: Diagnosis not present

## 2018-11-28 DIAGNOSIS — M549 Dorsalgia, unspecified: Secondary | ICD-10-CM | POA: Diagnosis not present

## 2018-11-28 DIAGNOSIS — E7800 Pure hypercholesterolemia, unspecified: Secondary | ICD-10-CM | POA: Diagnosis not present

## 2018-11-28 DIAGNOSIS — F419 Anxiety disorder, unspecified: Secondary | ICD-10-CM | POA: Diagnosis not present

## 2018-11-28 DIAGNOSIS — Z23 Encounter for immunization: Secondary | ICD-10-CM | POA: Diagnosis not present

## 2018-11-28 DIAGNOSIS — Z1339 Encounter for screening examination for other mental health and behavioral disorders: Secondary | ICD-10-CM | POA: Diagnosis not present

## 2018-12-16 DIAGNOSIS — Z1231 Encounter for screening mammogram for malignant neoplasm of breast: Secondary | ICD-10-CM | POA: Diagnosis not present

## 2019-02-05 DIAGNOSIS — Z1211 Encounter for screening for malignant neoplasm of colon: Secondary | ICD-10-CM | POA: Diagnosis not present

## 2019-03-04 DIAGNOSIS — Z01818 Encounter for other preprocedural examination: Secondary | ICD-10-CM | POA: Diagnosis not present

## 2019-03-06 DIAGNOSIS — Z7982 Long term (current) use of aspirin: Secondary | ICD-10-CM | POA: Diagnosis not present

## 2019-03-06 DIAGNOSIS — R195 Other fecal abnormalities: Secondary | ICD-10-CM | POA: Diagnosis not present

## 2019-03-06 DIAGNOSIS — E7800 Pure hypercholesterolemia, unspecified: Secondary | ICD-10-CM | POA: Diagnosis not present

## 2019-03-06 DIAGNOSIS — K6389 Other specified diseases of intestine: Secondary | ICD-10-CM | POA: Diagnosis not present

## 2019-03-06 DIAGNOSIS — K219 Gastro-esophageal reflux disease without esophagitis: Secondary | ICD-10-CM | POA: Diagnosis not present

## 2019-03-06 DIAGNOSIS — Z79899 Other long term (current) drug therapy: Secondary | ICD-10-CM | POA: Diagnosis not present

## 2019-03-06 DIAGNOSIS — Q438 Other specified congenital malformations of intestine: Secondary | ICD-10-CM | POA: Diagnosis not present

## 2019-03-06 DIAGNOSIS — F419 Anxiety disorder, unspecified: Secondary | ICD-10-CM | POA: Diagnosis not present

## 2019-03-06 DIAGNOSIS — M1990 Unspecified osteoarthritis, unspecified site: Secondary | ICD-10-CM | POA: Diagnosis not present

## 2019-05-04 DIAGNOSIS — M542 Cervicalgia: Secondary | ICD-10-CM | POA: Diagnosis not present

## 2019-05-04 DIAGNOSIS — M549 Dorsalgia, unspecified: Secondary | ICD-10-CM | POA: Diagnosis not present

## 2019-06-24 DIAGNOSIS — U071 COVID-19: Secondary | ICD-10-CM | POA: Diagnosis not present

## 2019-06-24 DIAGNOSIS — G629 Polyneuropathy, unspecified: Secondary | ICD-10-CM | POA: Diagnosis not present

## 2019-06-24 DIAGNOSIS — E7800 Pure hypercholesterolemia, unspecified: Secondary | ICD-10-CM | POA: Diagnosis not present

## 2019-06-24 DIAGNOSIS — Z299 Encounter for prophylactic measures, unspecified: Secondary | ICD-10-CM | POA: Diagnosis not present

## 2019-09-26 DIAGNOSIS — F5101 Primary insomnia: Secondary | ICD-10-CM | POA: Diagnosis not present

## 2019-09-26 DIAGNOSIS — M797 Fibromyalgia: Secondary | ICD-10-CM | POA: Diagnosis not present

## 2019-09-26 DIAGNOSIS — Z0189 Encounter for other specified special examinations: Secondary | ICD-10-CM | POA: Diagnosis not present

## 2019-09-26 DIAGNOSIS — F330 Major depressive disorder, recurrent, mild: Secondary | ICD-10-CM | POA: Diagnosis not present

## 2019-09-26 DIAGNOSIS — K219 Gastro-esophageal reflux disease without esophagitis: Secondary | ICD-10-CM | POA: Diagnosis not present

## 2019-09-26 DIAGNOSIS — F411 Generalized anxiety disorder: Secondary | ICD-10-CM | POA: Diagnosis not present

## 2019-09-26 DIAGNOSIS — G43009 Migraine without aura, not intractable, without status migrainosus: Secondary | ICD-10-CM | POA: Diagnosis not present

## 2019-10-03 DIAGNOSIS — K219 Gastro-esophageal reflux disease without esophagitis: Secondary | ICD-10-CM | POA: Diagnosis not present

## 2019-10-03 DIAGNOSIS — M797 Fibromyalgia: Secondary | ICD-10-CM | POA: Diagnosis not present

## 2019-10-03 DIAGNOSIS — F330 Major depressive disorder, recurrent, mild: Secondary | ICD-10-CM | POA: Diagnosis not present

## 2019-10-03 DIAGNOSIS — F5101 Primary insomnia: Secondary | ICD-10-CM | POA: Diagnosis not present

## 2019-10-03 DIAGNOSIS — F411 Generalized anxiety disorder: Secondary | ICD-10-CM | POA: Diagnosis not present

## 2019-10-10 DIAGNOSIS — K219 Gastro-esophageal reflux disease without esophagitis: Secondary | ICD-10-CM | POA: Diagnosis not present

## 2019-10-10 DIAGNOSIS — Z136 Encounter for screening for cardiovascular disorders: Secondary | ICD-10-CM | POA: Diagnosis not present

## 2019-10-10 DIAGNOSIS — M797 Fibromyalgia: Secondary | ICD-10-CM | POA: Diagnosis not present

## 2019-10-10 DIAGNOSIS — F330 Major depressive disorder, recurrent, mild: Secondary | ICD-10-CM | POA: Diagnosis not present

## 2019-10-10 DIAGNOSIS — Z0189 Encounter for other specified special examinations: Secondary | ICD-10-CM | POA: Diagnosis not present

## 2019-10-10 DIAGNOSIS — F411 Generalized anxiety disorder: Secondary | ICD-10-CM | POA: Diagnosis not present

## 2019-10-10 DIAGNOSIS — R42 Dizziness and giddiness: Secondary | ICD-10-CM | POA: Diagnosis not present

## 2019-10-10 DIAGNOSIS — G43009 Migraine without aura, not intractable, without status migrainosus: Secondary | ICD-10-CM | POA: Diagnosis not present

## 2019-10-10 DIAGNOSIS — F5101 Primary insomnia: Secondary | ICD-10-CM | POA: Diagnosis not present

## 2020-02-01 ENCOUNTER — Telehealth: Payer: Self-pay

## 2020-02-01 ENCOUNTER — Other Ambulatory Visit: Payer: Self-pay

## 2020-02-01 ENCOUNTER — Ambulatory Visit
Admission: EM | Admit: 2020-02-01 | Discharge: 2020-02-01 | Disposition: A | Discharge status: Home / Self Care | Payer: PPO | Attending: Emergency Medicine | Admitting: Emergency Medicine

## 2020-02-01 DIAGNOSIS — M26622 Arthralgia of left temporomandibular joint: Secondary | ICD-10-CM | POA: Diagnosis not present

## 2020-02-01 DIAGNOSIS — J0140 Acute pansinusitis, unspecified: Secondary | ICD-10-CM | POA: Diagnosis not present

## 2020-02-01 DIAGNOSIS — H9202 Otalgia, left ear: Secondary | ICD-10-CM | POA: Diagnosis not present

## 2020-02-01 MED ORDER — IBUPROFEN 600 MG PO TABS: 600.0000 mg | ORAL_TABLET | Freq: Four times a day (QID) | ORAL | 0 refills | Status: AC | PRN

## 2020-02-01 MED ORDER — CYCLOBENZAPRINE HCL 10 MG PO TABS: 10.0000 mg | ORAL_TABLET | Freq: Every day | ORAL | 0 refills | Status: DC

## 2020-02-01 MED ORDER — FLUTICASONE PROPIONATE 50 MCG/ACT NA SUSP: 2.0000 | Freq: Every day | NASAL | 0 refills | Status: AC

## 2020-02-01 MED ORDER — FLUTICASONE PROPIONATE 50 MCG/ACT NA SUSP: 2.0000 | Freq: Every day | NASAL | 0 refills | Status: DC

## 2020-02-01 MED ORDER — IBUPROFEN 600 MG PO TABS: 600.0000 mg | ORAL_TABLET | Freq: Four times a day (QID) | ORAL | 0 refills | Status: DC | PRN

## 2020-02-01 NOTE — Discharge Instructions (Addendum)
Flonase, saline nasal irrigation with a Lloyd Huger med rinse and distilled water as often as you want, Mucinex or Mucinex D.  Take 600 mg ibuprofen combined with 1000 mg of Tylenol 3-4 times a day as needed, soft diet, Flexeril at night to help you prevent you from grinding your teeth.  Follow-up with your dentist if your ear pain persists.

## 2020-02-01 NOTE — ED Provider Notes (Signed)
HPI  SUBJECTIVE:  Savannah Rocha is a 64 y.o. female who presents with with 4 days of headache, dry cough, bilateral ear pain on the left more so than the right.  She reports nasal congestion, sinus pain and pressure.  No tinnitus, decreased hearing, otorrhea, vertigo, fevers, rhinorrhea, upper dental pain, facial swelling.  No wheezing, chest pain, shortness of breath.  No loss of sense of smell or taste, nausea no vomiting, diarrhea, abdominal pain.  No allergy symptoms.  No known flu or Covid exposure.  She does not get flu or Covid vaccine.  She is unsure if she grinds her teeth.  No antipyretic in the past 6 hours.  She states her daughter has been sick with similar symptoms.  She has tried Flonase, Mucinex, Tylenol with improvement in her symptoms.  She states symptoms are worse with lying down.  They are not associated with chewing or yawning.  Past medical history negative for diabetes, hypertension, peptic ulcer disease, GI bleed, chronic kidney disease.  PMD: Dr. Margo Aye   Past Medical History:  Diagnosis Date  . Arthritis   . Chronic lower back pain   . Depression   . GERD (gastroesophageal reflux disease)   . Hyperlipidemia   . Migraine without aura, without mention of intractable migraine without mention of status migrainosus 11/05/2013  . Migraines    last migraine 2 days ago    Past Surgical History:  Procedure Laterality Date  . ABDOMINAL HYSTERECTOMY    . CATARACT EXTRACTION W/PHACO  01/16/2012   Procedure: CATARACT EXTRACTION PHACO AND INTRAOCULAR LENS PLACEMENT (IOC);  Surgeon: Loraine Leriche T. Nile Riggs, MD;  Location: [facility];  Service: Ophthalmology;  Laterality: Right;  CDE:6.42  . CATARACT EXTRACTION W/PHACO  01/30/2012   Procedure: CATARACT EXTRACTION PHACO AND INTRAOCULAR LENS PLACEMENT (IOC);  Surgeon: Loraine Leriche T. Nile Riggs, MD;  Location: [facility];  Service: Ophthalmology;  Laterality: Left;  CDE:8.36  . HEMORRHOID SURGERY    . lower back     . NECK SURGERY     benign tumor  removed   . REFRACTIVE SURGERY    . right knee  1993  . TUBAL LIGATION    . YAG LASER APPLICATION Right 08/27/2012   Procedure: YAG LASER APPLICATION;  Surgeon: Loraine Leriche T. Nile Riggs, MD;  Location: [facility];  Service: Ophthalmology;  Laterality: Right;  . YAG LASER APPLICATION Left 09/24/2012   Procedure: YAG LASER APPLICATION;  Surgeon: Loraine Leriche T. Nile Riggs, MD;  Location: [facility];  Service: Ophthalmology;  Laterality: Left;    Family History  Problem Relation Age of Onset  . Cancer Father        lung  . Depression Sister   . Hypertension Sister   . Hyperlipidemia Sister   . Depression Sister   . Heart disease Brother   . COPD Brother   . Cancer Brother     Social History   Tobacco Use  . Smoking status: Never Smoker  . Smokeless tobacco: Never Used  Substance Use Topics  . Alcohol use: No  . Drug use: No    No current facility-administered medications for this encounter.  Current Outpatient Medications:  .  calcium carbonate (OS-CAL) 600 MG TABS tablet, Take 600 mg by mouth., Disp: , Rfl:  .  cyclobenzaprine (FLEXERIL) 10 MG tablet, Take 1 tablet (10 mg total) by mouth at bedtime., Disp: 20 tablet, Rfl: 0 .  fluticasone (FLONASE) 50 MCG/ACT nasal spray, Place 2 sprays into both nostrils daily., Disp: 16 g, Rfl: 0 .  gemfibrozil (  LOPID) 600 MG tablet, Take 600 mg by mouth 2 (two) times daily., Disp: , Rfl:  .  ibuprofen (ADVIL) 600 MG tablet, Take 1 tablet (600 mg total) by mouth every 6 (six) hours as needed., Disp: 30 tablet, Rfl: 0 .  omeprazole (PRILOSEC) 20 MG capsule, Take 1 capsule (20 mg total) by mouth daily., Disp: 90 capsule, Rfl: 1 .  promethazine (PHENERGAN) 25 MG suppository, Place 1 suppository (25 mg total) rectally every 6 (six) hours as needed for nausea or vomiting., Disp: 12 each, Rfl: 0 .  simvastatin (ZOCOR) 40 MG tablet, Take 1 tablet (40 mg total) by mouth daily., Disp: 30 tablet, Rfl: 0 .  SUMAtriptan (IMITREX) 100 MG tablet, Take 1 tablet (100 mg total) by  mouth as needed for migraine., Disp: 10 tablet, Rfl: 6 .  venlafaxine XR (EFFEXOR-XR) 150 MG 24 hr capsule, Take 150 mg by mouth daily with breakfast., Disp: , Rfl:   No Known Allergies   ROS  As noted in HPI.   Physical Exam  BP 120/81   Pulse 100   Temp 98.3 F (36.8 C)   Resp 18   SpO2 96%   Constitutional: Well developed, well nourished, no acute distress Eyes:  EOMI, conjunctiva normal bilaterally HENT: Normocephalic, atraumatic,mucus membranes moist.  Bilateral external ears, external ear canals normal.  TMs normal bilaterally.  Left TMJ tenderness which reproduces her ear pain.  No crepitus.  No mastoid tenderness, pain with traction on pinna.  Positive nasal congestion, frontal, maxillary sinus tenderness, normal turbinates. Respiratory: Normal inspiratory effort Cardiovascular: Normal rate GI: nondistended skin: No rash, skin intact Musculoskeletal: no deformities Neurologic: Alert & oriented x 3, no focal neuro deficits Psychiatric: Speech and behavior appropriate   ED Course   Medications - No data to display  No orders of the defined types were placed in this encounter.   No results found for this or any previous visit (from the past 24 hour(s)). No results found.  ED Clinical Impression  1. Acute non-recurrent pansinusitis   2. Arthralgia of left temporomandibular joint   3. Otalgia of left ear      ED Assessment/Plan  Patient with a sinusitis, most likely viral at this point in time.  Flonase, saline nasal irrigation, Mucinex or Mucinex D.  Follow-up with PMD or here in 6 days not better we can can start her antibiotics at that time.  She also has some left TMJ tenderness which I think is causing her ear pain.  There is no evidence of an otitis media, mastoiditis, external otitis.  Home with ibuprofen 600 mg combined with 1000 milligrams of Tylenol 3-4 times a day as needed, soft diet, Flexeril at night only, follow-up with dentistry.  Discussed  medical decision-making, treatment plan and plan for follow-up with patient.  She agrees with plan.  Meds ordered this encounter  Medications  . fluticasone (FLONASE) 50 MCG/ACT nasal spray    Sig: Place 2 sprays into both nostrils daily.    Dispense:  16 g    Refill:  0  . ibuprofen (ADVIL) 600 MG tablet    Sig: Take 1 tablet (600 mg total) by mouth every 6 (six) hours as needed.    Dispense:  30 tablet    Refill:  0  . cyclobenzaprine (FLEXERIL) 10 MG tablet    Sig: Take 1 tablet (10 mg total) by mouth at bedtime.    Dispense:  20 tablet    Refill:  0    *This clinic  note was created using Scientist, clinical (histocompatibility and immunogenetics). Therefore, there may be occasional mistakes despite careful proofreading.   ?    Domenick Gong, MD 02/02/20 0630

## 2020-02-01 NOTE — ED Triage Notes (Signed)
Pt presents with c/o sinus pain and ear pain since wednesday

## 2020-02-14 ENCOUNTER — Ambulatory Visit
Admission: EM | Admit: 2020-02-14 | Discharge: 2020-02-14 | Disposition: A | Discharge status: Home / Self Care | Payer: PPO | Attending: Internal Medicine | Admitting: Internal Medicine

## 2020-02-14 ENCOUNTER — Other Ambulatory Visit: Payer: Self-pay

## 2020-02-14 DIAGNOSIS — G4486 Cervicogenic headache: Secondary | ICD-10-CM | POA: Diagnosis not present

## 2020-02-14 NOTE — Discharge Instructions (Signed)
Gentle stretches and range of motion exercise Take ibuprofen as prescribed If you experience confusion, persistent vomiting, nausea or worsening headache please return to the urgent care to be reevaluated.
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
## 2020-02-14 NOTE — ED Triage Notes (Signed)
Pt has c/o headache and dizziness

## 2020-02-21 ENCOUNTER — Emergency Department (HOSPITAL_COMMUNITY): Payer: PPO

## 2020-02-21 ENCOUNTER — Emergency Department (HOSPITAL_COMMUNITY)
Admission: EM | Admit: 2020-02-21 | Discharge: 2020-02-21 | Disposition: A | Discharge status: Home / Self Care | Payer: PPO | Attending: Emergency Medicine | Admitting: Emergency Medicine

## 2020-02-21 ENCOUNTER — Other Ambulatory Visit: Payer: Self-pay

## 2020-02-21 DIAGNOSIS — S5001XA Contusion of right elbow, initial encounter: Secondary | ICD-10-CM | POA: Diagnosis not present

## 2020-02-21 DIAGNOSIS — W108XXA Fall (on) (from) other stairs and steps, initial encounter: Secondary | ICD-10-CM | POA: Insufficient documentation

## 2020-02-21 DIAGNOSIS — J984 Other disorders of lung: Secondary | ICD-10-CM | POA: Diagnosis not present

## 2020-02-21 DIAGNOSIS — M160 Bilateral primary osteoarthritis of hip: Secondary | ICD-10-CM | POA: Insufficient documentation

## 2020-02-21 DIAGNOSIS — S7001XA Contusion of right hip, initial encounter: Secondary | ICD-10-CM

## 2020-02-21 DIAGNOSIS — R519 Headache, unspecified: Secondary | ICD-10-CM | POA: Insufficient documentation

## 2020-02-21 DIAGNOSIS — R079 Chest pain, unspecified: Secondary | ICD-10-CM | POA: Diagnosis not present

## 2020-02-21 DIAGNOSIS — M25551 Pain in right hip: Secondary | ICD-10-CM | POA: Diagnosis not present

## 2020-02-21 DIAGNOSIS — S79911A Unspecified injury of right hip, initial encounter: Secondary | ICD-10-CM | POA: Diagnosis present

## 2020-02-21 DIAGNOSIS — R222 Localized swelling, mass and lump, trunk: Secondary | ICD-10-CM | POA: Diagnosis not present

## 2020-02-21 DIAGNOSIS — R110 Nausea: Secondary | ICD-10-CM | POA: Diagnosis not present

## 2020-02-21 DIAGNOSIS — I6782 Cerebral ischemia: Secondary | ICD-10-CM | POA: Insufficient documentation

## 2020-02-21 DIAGNOSIS — S0990XA Unspecified injury of head, initial encounter: Secondary | ICD-10-CM | POA: Diagnosis not present

## 2020-02-21 DIAGNOSIS — M47812 Spondylosis without myelopathy or radiculopathy, cervical region: Secondary | ICD-10-CM | POA: Diagnosis not present

## 2020-02-21 DIAGNOSIS — I700 Atherosclerosis of aorta: Secondary | ICD-10-CM | POA: Diagnosis not present

## 2020-02-21 DIAGNOSIS — R918 Other nonspecific abnormal finding of lung field: Secondary | ICD-10-CM | POA: Diagnosis not present

## 2020-02-21 DIAGNOSIS — S3991XA Unspecified injury of abdomen, initial encounter: Secondary | ICD-10-CM | POA: Diagnosis not present

## 2020-02-21 DIAGNOSIS — S299XXA Unspecified injury of thorax, initial encounter: Secondary | ICD-10-CM | POA: Diagnosis not present

## 2020-02-21 DIAGNOSIS — M25571 Pain in right ankle and joints of right foot: Secondary | ICD-10-CM | POA: Diagnosis not present

## 2020-02-21 DIAGNOSIS — N200 Calculus of kidney: Secondary | ICD-10-CM | POA: Diagnosis not present

## 2020-02-21 DIAGNOSIS — M25532 Pain in left wrist: Secondary | ICD-10-CM | POA: Diagnosis not present

## 2020-02-21 DIAGNOSIS — W19XXXA Unspecified fall, initial encounter: Secondary | ICD-10-CM | POA: Diagnosis not present

## 2020-02-21 DIAGNOSIS — Z79899 Other long term (current) drug therapy: Secondary | ICD-10-CM | POA: Insufficient documentation

## 2020-02-21 DIAGNOSIS — M25521 Pain in right elbow: Secondary | ICD-10-CM | POA: Diagnosis not present

## 2020-02-21 DIAGNOSIS — R220 Localized swelling, mass and lump, head: Secondary | ICD-10-CM | POA: Diagnosis not present

## 2020-02-21 DIAGNOSIS — J9811 Atelectasis: Secondary | ICD-10-CM | POA: Diagnosis not present

## 2020-02-21 LAB — I-STAT CHEM 8, ED
BUN: 10 mg/dL (ref 8–23)
Calcium, Ion: 1.15 mmol/L (ref 1.15–1.40)
Chloride: 106 mmol/L (ref 98–111)
Creatinine, Ser: 0.5 mg/dL (ref 0.44–1.00)
Glucose, Bld: 109 mg/dL — ABNORMAL HIGH (ref 70–99)
HCT: 33 % — ABNORMAL LOW (ref 36.0–46.0)
Hemoglobin: 11.2 g/dL — ABNORMAL LOW (ref 12.0–15.0)
Potassium: 3.8 mmol/L (ref 3.5–5.1)
Sodium: 142 mmol/L (ref 135–145)
TCO2: 25 mmol/L (ref 22–32)

## 2020-02-21 LAB — COMPREHENSIVE METABOLIC PANEL
ALT: 21 U/L (ref 0–44)
AST: 27 U/L (ref 15–41)
Albumin: 3.5 g/dL (ref 3.5–5.0)
Alkaline Phosphatase: 61 U/L (ref 38–126)
Anion gap: 8 (ref 5–15)
BUN: 9 mg/dL (ref 8–23)
CO2: 25 mmol/L (ref 22–32)
Calcium: 8.9 mg/dL (ref 8.9–10.3)
Chloride: 107 mmol/L (ref 98–111)
Creatinine, Ser: 0.69 mg/dL (ref 0.44–1.00)
GFR, Estimated: >60 mL/min (ref >60)
Glucose, Bld: 115 mg/dL — ABNORMAL HIGH (ref 70–99)
Potassium: 3.7 mmol/L (ref 3.5–5.1)
Sodium: 140 mmol/L (ref 135–145)
Total Bilirubin: 0.7 mg/dL (ref 0.3–1.2)
Total Protein: 6 g/dL — ABNORMAL LOW (ref 6.5–8.1)

## 2020-02-21 LAB — URINALYSIS, ROUTINE W REFLEX MICROSCOPIC
Bacteria, UA: NONE SEEN
Bilirubin Urine: NEGATIVE
Glucose, UA: NEGATIVE mg/dL
Hgb urine dipstick: NEGATIVE
Ketones, ur: NEGATIVE mg/dL
Nitrite: NEGATIVE
Protein, ur: NEGATIVE mg/dL
Specific Gravity, Urine: 1.005 (ref 1.005–1.030)
pH: 7 (ref 5.0–8.0)

## 2020-02-21 LAB — CBC
HCT: 36.1 % (ref 36.0–46.0)
Hemoglobin: 11.8 g/dL — ABNORMAL LOW (ref 12.0–15.0)
MCH: 30 pg (ref 26.0–34.0)
MCHC: 32.7 g/dL (ref 30.0–36.0)
MCV: 91.9 fL (ref 80.0–100.0)
Platelets: 131 10*3/uL — ABNORMAL LOW (ref 150–400)
RBC: 3.93 MIL/uL (ref 3.87–5.11)
RDW: 14 % (ref 11.5–15.5)
WBC: 4.5 10*3/uL (ref 4.0–10.5)
nRBC: 0 % (ref 0.0–0.2)

## 2020-02-21 LAB — PROTIME-INR
INR: 1.1 (ref 0.8–1.2)
Prothrombin Time: 13.7 seconds (ref 11.4–15.2)

## 2020-02-21 LAB — LACTIC ACID, PLASMA: Lactic Acid, Venous: 1.6 mmol/L (ref 0.5–1.9)

## 2020-02-21 LAB — SAMPLE TO BLOOD BANK

## 2020-02-21 MED ORDER — ACETAMINOPHEN 500 MG PO TABS
1000.0000 mg | ORAL_TABLET | Freq: Once | ORAL | Status: AC
  Administered 2020-02-21: 1000 mg via ORAL
  Filled 2020-02-21: qty 2

## 2020-02-21 MED ORDER — OXYCODONE HCL 5 MG PO TABS: 5.0000 mg | ORAL_TABLET | Freq: Four times a day (QID) | ORAL | 0 refills | Status: DC | PRN

## 2020-02-21 MED ORDER — IOHEXOL 300 MG/ML  SOLN
100.0000 mL | Freq: Once | INTRAMUSCULAR | Status: AC | PRN
  Administered 2020-02-21: 100 mL via INTRAVENOUS

## 2020-02-21 MED ORDER — SODIUM CHLORIDE 0.9 % IV BOLUS
500.0000 mL | Freq: Once | INTRAVENOUS | Status: AC
  Administered 2020-02-21: 500 mL via INTRAVENOUS

## 2020-02-21 MED ORDER — OXYCODONE HCL 5 MG PO TABS
5.0000 mg | ORAL_TABLET | Freq: Once | ORAL | Status: AC
Start: 2020-02-21 — End: 2020-02-21
  Administered 2020-02-21: 5 mg via ORAL
  Filled 2020-02-21: qty 1

## 2020-02-21 NOTE — Discharge Instructions (Addendum)
There is a benign-appearing nodule in the right lower lung, not likely to cause trouble or need follow-up, but let your primary care provider know.

## 2020-02-21 NOTE — ED Triage Notes (Signed)
Pt BIB [facility] w/ complaints of a mechanical fall down three steps onto concrete. Pt states she usually wears glasses but when she went out to the mailbox she missed a step and fell. Pt fell on R side, mainly complaining of R sided shoulder, elbow, hip and leg pain from the fall. Pt complaining of a headache and neck pain rating 7 out of 10. Pt denies LOC or on a blood thinner. Swelling noted to the R hip. No shortening noticed. Pt VS stable w/ EMS, BP 109/75 P-82 SpO2-97% Resp 18. NAD at this time.

## 2020-02-21 NOTE — ED Provider Notes (Signed)
[facility] EMERGENCY DEPARTMENT Provider Note   CSN: [id number] Arrival date & time: 02/21/20  2536     History Chief Complaint  Patient presents with  . Fall    Savannah Rocha is a 64 y.o. female.   Fall This is a new problem. The current episode started 1 to 2 hours ago. The problem has not changed since onset.Associated symptoms include chest pain. Pertinent negatives include no headaches and no shortness of breath. Nothing aggravates the symptoms. Nothing relieves the symptoms. She has tried nothing for the symptoms. The treatment provided mild relief.       Past Medical History:  Diagnosis Date  . Arthritis   . Chronic lower back pain   . Depression   . GERD (gastroesophageal reflux disease)   . Hyperlipidemia   . Migraine without aura, without mention of intractable migraine without mention of status migrainosus 11/05/2013  . Migraines    last migraine 2 days ago    Patient Active Problem List   Diagnosis Date Noted  . Nausea with vomiting 09/10/2014  . Migraine without aura 11/05/2013  . Tennis elbow syndrome 10/01/2013  . Rotator cuff syndrome of left shoulder 07/03/2012  . CTS (carpal tunnel syndrome) 07/03/2012  . Rotator cuff strain 06/18/2012  . Fatigue 03/21/2012  . Paresthesia 03/21/2012  . Hypotension 03/21/2012  . Insomnia 11/19/2011  . Fibromyalgia 07/25/2011  . Chronic constipation 06/20/2011  . Chronic insomnia 06/20/2011  . Muscle ache of extremity 06/20/2011  . Depression 06/19/2011  . Hyperlipidemia 06/19/2011  . Long term use of drug 06/19/2011  . Migraines 06/19/2011    Past Surgical History:  Procedure Laterality Date  . ABDOMINAL HYSTERECTOMY    . CATARACT EXTRACTION W/PHACO  01/16/2012   Procedure: CATARACT EXTRACTION PHACO AND INTRAOCULAR LENS PLACEMENT (IOC);  Surgeon: Loraine Leriche T. Nile Riggs, MD;  Location: [facility];  Service: Ophthalmology;  Laterality: Right;  CDE:6.42  . CATARACT EXTRACTION W/PHACO  01/30/2012    Procedure: CATARACT EXTRACTION PHACO AND INTRAOCULAR LENS PLACEMENT (IOC);  Surgeon: Loraine Leriche T. Nile Riggs, MD;  Location: [facility];  Service: Ophthalmology;  Laterality: Left;  CDE:8.36  . HEMORRHOID SURGERY    . lower back     . NECK SURGERY     benign tumor removed   . REFRACTIVE SURGERY    . right knee  1993  . TUBAL LIGATION    . YAG LASER APPLICATION Right 08/27/2012   Procedure: YAG LASER APPLICATION;  Surgeon: Loraine Leriche T. Nile Riggs, MD;  Location: [facility];  Service: Ophthalmology;  Laterality: Right;  . YAG LASER APPLICATION Left 09/24/2012   Procedure: YAG LASER APPLICATION;  Surgeon: Loraine Leriche T. Nile Riggs, MD;  Location: [facility];  Service: Ophthalmology;  Laterality: Left;     OB History   No obstetric history on file.     Family History  Problem Relation Age of Onset  . Cancer Father        lung  . Depression Sister   . Hypertension Sister   . Hyperlipidemia Sister   . Depression Sister   . Heart disease Brother   . COPD Brother   . Cancer Brother     Social History   Tobacco Use  . Smoking status: Never Smoker  . Smokeless tobacco: Never Used  Substance Use Topics  . Alcohol use: No  . Drug use: No    Home Medications Prior to Admission medications   Medication Sig Start Date End Date Taking? Authorizing Provider  calcium carbonate (OS-CAL) 600 MG  TABS tablet Take 600 mg by mouth.    [provider]  cyclobenzaprine (FLEXERIL) 10 MG tablet Take 1 tablet (10 mg total) by mouth at bedtime. 02/01/20   Domenick Gong, MD  fluticasone (FLONASE) 50 MCG/ACT nasal spray Place 2 sprays into both nostrils daily. 02/01/20   Domenick Gong, MD  gemfibrozil (LOPID) 600 MG tablet Take 600 mg by mouth 2 (two) times daily.    [provider]  ibuprofen (ADVIL) 600 MG tablet Take 1 tablet (600 mg total) by mouth every 6 (six) hours as needed. 02/01/20   Domenick Gong, MD  omeprazole (PRILOSEC) 20 MG capsule Take 1 capsule (20 mg total) by mouth daily. 03/19/12    [place], Velna Hatchet, MD  oxyCODONE (ROXICODONE) 5 MG immediate release tablet Take 1 tablet (5 mg total) by mouth every 6 (six) hours as needed for up to 12 doses for severe pain. 02/21/20   Sabino Donovan, MD  promethazine (PHENERGAN) 25 MG suppository Place 1 suppository (25 mg total) rectally every 6 (six) hours as needed for nausea or vomiting. 09/10/14   Nilda Riggs, NP  simvastatin (ZOCOR) 40 MG tablet Take 1 tablet (40 mg total) by mouth daily. 01/31/13   [place], Velna Hatchet, MD  SUMAtriptan (IMITREX) 100 MG tablet Take 1 tablet (100 mg total) by mouth as needed for migraine. 11/03/14   Nilda Riggs, NP  venlafaxine XR (EFFEXOR-XR) 150 MG 24 hr capsule Take 150 mg by mouth daily with breakfast.    [provider]  propranolol (INDERAL) 10 MG tablet TAKE 2 TABLETS BY MOUTH TWICE DAILY 11/01/15 02/01/20  York Spaniel, MD    Allergies    Patient has no known allergies.  Review of Systems   Review of Systems  Constitutional: Negative for chills and fever.  HENT: Negative for congestion and rhinorrhea.   Respiratory: Negative for cough and shortness of breath.   Cardiovascular: Positive for chest pain. Negative for palpitations.  Gastrointestinal: Negative for diarrhea, nausea and vomiting.  Genitourinary: Negative for difficulty urinating and dysuria.  Musculoskeletal: Positive for arthralgias and neck pain. Negative for back pain.  Skin: Negative for rash and wound.  Neurological: Negative for light-headedness and headaches.    Physical Exam Updated Vital Signs BP 122/73   Pulse 74   Temp 98.3 F (36.8 C) (Oral)   Resp 18   Ht 5\' 7"  (1.702 m)   Wt 68 kg   SpO2 99%   BMI 23.49 kg/m   Physical Exam Vitals and nursing note reviewed. Exam conducted with a chaperone present.  Constitutional:      General: She is not in acute distress.    Appearance: Normal appearance.  HENT:     Head: Normocephalic and atraumatic.     Nose: No rhinorrhea.  Eyes:      General:        Right eye: No discharge.        Left eye: No discharge.     Conjunctiva/sclera: Conjunctivae normal.  Neck:     Comments: ttp of the midline neck  Cardiovascular:     Rate and Rhythm: Normal rate and regular rhythm.  Pulmonary:     Effort: Pulmonary effort is normal. No respiratory distress.     Breath sounds: No stridor.  Chest:     Chest wall: Tenderness (right ) present.  Abdominal:     General: Abdomen is flat. There is no distension.     Palpations: Abdomen is soft.  Tenderness: There is no abdominal tenderness.  Musculoskeletal:        General: Swelling, tenderness and deformity present. No signs of injury.     Comments: ttp of the right hip, left wirst, right elbow with hematoma, NVI in all ext, decreased ROM in right hip   Skin:    General: Skin is warm and dry.  Neurological:     General: No focal deficit present.     Mental Status: She is alert. Mental status is at baseline.     Motor: No weakness.  Psychiatric:        Mood and Affect: Mood normal.        Behavior: Behavior normal.     ED Results / Procedures / Treatments   Labs (all labs ordered are listed, but only abnormal results are displayed) Labs Reviewed  COMPREHENSIVE METABOLIC PANEL - Abnormal; Notable for the following components:      Result Value   Glucose, Bld 115 (*)    Total Protein 6.0 (*)    All other components within normal limits  CBC - Abnormal; Notable for the following components:   Hemoglobin 11.8 (*)    Platelets 131 (*)    All other components within normal limits  URINALYSIS, ROUTINE W REFLEX MICROSCOPIC - Abnormal; Notable for the following components:   Leukocytes,Ua TRACE (*)    All other components within normal limits  I-STAT CHEM 8, ED - Abnormal; Notable for the following components:   Glucose, Bld 109 (*)    Hemoglobin 11.2 (*)    HCT 33.0 (*)    All other components within normal limits  LACTIC ACID, PLASMA  PROTIME-INR  SAMPLE TO BLOOD BANK     EKG EKG Interpretation  Date/Time:  Saturday February 21 2020 09:44:01 EST Ventricular Rate:  78 PR Interval:    QRS Duration: 83 QT Interval:  372 QTC Calculation: 424 R Axis:   51 Text Interpretation: Sinus rhythm Borderline ST elevation, lateral leads Confirmed by Cherlynn Perches ([id number]) on 02/21/2020 10:09:49 AM   Radiology DG Chest 1 View  Result Date: 02/21/2020 CLINICAL DATA:  Fall with right chest pain EXAM: CHEST  1 VIEW COMPARISON:  03/23/2014 chest radiograph. FINDINGS: Stable cardiomediastinal silhouette with top-normal heart size. No pneumothorax. No pleural effusion. Low lung volumes. No pulmonary edema. Minimal left basilar scarring versus atelectasis. No acute consolidative airspace disease. No displaced fractures in the visualized chest. IMPRESSION: Low lung volumes with minimal left basilar scarring versus atelectasis. Electronically Signed   By: Delbert Phenix M.D.   On: 02/21/2020 11:24   DG Elbow Complete Right  Result Date: 02/21/2020 CLINICAL DATA:  Fall today with right elbow pain EXAM: RIGHT ELBOW - COMPLETE 3+ VIEW COMPARISON:  None. FINDINGS: There is no evidence of fracture, dislocation, or joint effusion. There is no evidence of arthropathy or other focal bone abnormality. Soft tissues are unremarkable. IMPRESSION: No right elbow fracture, joint effusion or dislocation. Electronically Signed   By: Delbert Phenix M.D.   On: 02/21/2020 11:25   DG Wrist Complete Left  Result Date: 02/21/2020 CLINICAL DATA:  Fall with left wrist pain EXAM: LEFT WRIST - COMPLETE 3+ VIEW COMPARISON:  None. FINDINGS: No fracture or dislocation. Mild first carpometacarpal joint osteoarthritis. No focal osseous lesions. No radiopaque foreign bodies. Left wrist IMPRESSION: No left wrist fracture or dislocation. Mild first carpometacarpal joint osteoarthritis. Electronically Signed   By: Delbert Phenix M.D.   On: 02/21/2020 11:22   CT HEAD WO CONTRAST  Result Date:  02/21/2020 CLINICAL  DATA:  Mechanical fall down steps. Midline neck tenderness. EXAM: CT HEAD WITHOUT CONTRAST CT CERVICAL SPINE WITHOUT CONTRAST TECHNIQUE: Multidetector CT imaging of the head and cervical spine was performed following the standard protocol without intravenous contrast. Multiplanar CT image reconstructions of the cervical spine were also generated. COMPARISON:  03/23/2014 head and cervical spine CT. FINDINGS: CT HEAD FINDINGS Brain: No evidence of parenchymal hemorrhage or extra-axial fluid collection. No mass lesion, mass effect, or midline shift. No CT evidence of acute infarction. Nonspecific mild subcortical and periventricular white matter hypodensity, most in keeping with chronic small vessel ischemic change. Cerebral volume is age appropriate. No ventriculomegaly. Vascular: No acute abnormality. Skull: No evidence of calvarial fracture. Sinuses/Orbits: The visualized paranasal sinuses are essentially clear. Other:  The mastoid air cells are unopacified. CT CERVICAL SPINE FINDINGS Alignment: Straightening of the cervical spine. No facet subluxation. Dens is well positioned between the lateral masses of C1. Skull base and vertebrae: No acute fracture. No primary bone lesion or focal pathologic process. Incomplete ossification of right C7 lamina, unchanged, presumably congenital. Soft tissues and spinal canal: No prevertebral edema. No visible canal hematoma. Disc levels: Mild multilevel cervical degenerative disc disease, most prominent at C4-5. Mild-to-moderate facet arthropathy, asymmetric to the right. No significant degenerative foraminal stenosis. Upper chest: Mosaic attenuation at the lung apices. Other: Visualized mastoid air cells appear clear. Stable heterogeneous thyroid gland without discrete thyroid nodules. No pathologically enlarged cervical nodes. IMPRESSION: 1. No evidence of acute intracranial abnormality. No evidence of calvarial fracture. 2. Mild chronic small vessel ischemic changes in the  cerebral white matter. 3. No cervical spine fracture or subluxation. 4. Mild-to-moderate multilevel degenerative changes in the cervical spine as detailed. 5. Mosaic attenuation at the lung apices, nonspecific, most commonly due to air trapping from small airways disease. Electronically Signed   By: Delbert Phenix M.D.   On: 02/21/2020 12:09   CT CERVICAL SPINE WO CONTRAST  Result Date: 02/21/2020 CLINICAL DATA:  Mechanical fall down steps. Midline neck tenderness. EXAM: CT HEAD WITHOUT CONTRAST CT CERVICAL SPINE WITHOUT CONTRAST TECHNIQUE: Multidetector CT imaging of the head and cervical spine was performed following the standard protocol without intravenous contrast. Multiplanar CT image reconstructions of the cervical spine were also generated. COMPARISON:  03/23/2014 head and cervical spine CT. FINDINGS: CT HEAD FINDINGS Brain: No evidence of parenchymal hemorrhage or extra-axial fluid collection. No mass lesion, mass effect, or midline shift. No CT evidence of acute infarction. Nonspecific mild subcortical and periventricular white matter hypodensity, most in keeping with chronic small vessel ischemic change. Cerebral volume is age appropriate. No ventriculomegaly. Vascular: No acute abnormality. Skull: No evidence of calvarial fracture. Sinuses/Orbits: The visualized paranasal sinuses are essentially clear. Other:  The mastoid air cells are unopacified. CT CERVICAL SPINE FINDINGS Alignment: Straightening of the cervical spine. No facet subluxation. Dens is well positioned between the lateral masses of C1. Skull base and vertebrae: No acute fracture. No primary bone lesion or focal pathologic process. Incomplete ossification of right C7 lamina, unchanged, presumably congenital. Soft tissues and spinal canal: No prevertebral edema. No visible canal hematoma. Disc levels: Mild multilevel cervical degenerative disc disease, most prominent at C4-5. Mild-to-moderate facet arthropathy, asymmetric to the right. No  significant degenerative foraminal stenosis. Upper chest: Mosaic attenuation at the lung apices. Other: Visualized mastoid air cells appear clear. Stable heterogeneous thyroid gland without discrete thyroid nodules. No pathologically enlarged cervical nodes. IMPRESSION: 1. No evidence of acute intracranial abnormality. No evidence of calvarial fracture. 2. Mild chronic  small vessel ischemic changes in the cerebral white matter. 3. No cervical spine fracture or subluxation. 4. Mild-to-moderate multilevel degenerative changes in the cervical spine as detailed. 5. Mosaic attenuation at the lung apices, nonspecific, most commonly due to air trapping from small airways disease. Electronically Signed   By: Delbert PhenixJason A Poff M.D.   On: 02/21/2020 12:09   CT CHEST ABDOMEN PELVIS W CONTRAST  Result Date: 02/21/2020 CLINICAL DATA:  Fall EXAM: CT CHEST, ABDOMEN, AND PELVIS WITH CONTRAST TECHNIQUE: Multidetector CT imaging of the chest, abdomen and pelvis was performed following the standard protocol during bolus administration of intravenous contrast. CONTRAST:  100mL OMNIPAQUE IOHEXOL 300 MG/ML  SOLN COMPARISON:  None. FINDINGS: CT CHEST FINDINGS Cardiovascular: LEFT-sided coronary artery atherosclerotic calcifications. Moderate atherosclerotic calcifications throughout the aorta. No evidence of acute aortic injury with mildly limit evaluation of the aortic root secondary to cardiac motion. Normal heart size. No pericardial effusion. Mediastinum/Nodes: Subcentimeter thyroid nodules. No suspicious lymphadenopathy. Lungs/Pleura: No pleural effusion or pneumothorax. Scattered atelectasis. There is a 3 mm subpleural pulmonary nodule the RIGHT middle lobe (series 5, image 90). There is a 5 mm pulmonary nodule the RIGHT lower lobe (series 5, image 92). Musculoskeletal: Incomplete fusion of the RIGHT posterior arch of the inferior cervical spine, better assessed on same day cervical spine CT. No acute fracture of the thoracic spine.  CT ABDOMEN PELVIS FINDINGS Hepatobiliary: No hepatic injury or perihepatic hematoma. Gallbladder is unremarkable hepatic steatosis. Portal vein is patent. Pancreas: Unremarkable. No pancreatic ductal dilatation or surrounding inflammatory changes. Spleen: No splenic injury or perisplenic hematoma. Adrenals/Urinary Tract: No adrenal hemorrhage or renal injury identified. Bladder is unremarkable. Cortical scarring of the inferior pole of the RIGHT kidney. Scattered subcentimeter bilateral nephrolithiasis. Kidneys enhance symmetrically. Subcentimeter hypodense lesions are too small to accurately characterize. No hydronephrosis. Bladder is unremarkable. Stomach/Bowel: Stomach is within normal limits. Appendix appears normal. No evidence of bowel wall thickening, distention, or inflammatory changes. Vascular/Lymphatic: Atherosclerotic calcifications of the aorta. No suspicious lymphadenopathy. Reproductive: Status post hysterectomy.  No adnexal masses. Other: Subcutaneous hematoma and fat stranding along the RIGHT proximal thigh. No evidence of active contrast extravasation. Musculoskeletal: Status post posterior fixation of the lower lumbar spine. No acute fracture of the lumbar spine. IMPRESSION: 1. No evidence of acute traumatic injury within the chest, abdomen, or pelvis. 2. Subcutaneous hematoma and fat stranding along the RIGHT proximal thigh. No evidence of active contrast extravasation. 3. Small pulmonary nodules measuring up to 5 mm in the RIGHT lower lobe. No follow-up needed if patient is low-risk (and has no known or suspected primary neoplasm). Non-contrast chest CT can be considered in 12 months if patient is high-risk. This recommendation follows the consensus statement: Guidelines for Management of Incidental Pulmonary Nodules Detected on CT Images: From the Fleischner Society 2017; Radiology 2017; 284:228-243. Aortic Atherosclerosis (ICD10-I70.0). Electronically Signed   By: Meda KlinefelterStephanie  Peacock MD   On:  02/21/2020 12:18   DG Ankle Right Port  Result Date: 02/21/2020 CLINICAL DATA:  Fall today with right ankle pain laterally EXAM: PORTABLE RIGHT ANKLE - 2 VIEW COMPARISON:  None. FINDINGS: No fracture or subluxation. No focal osseous lesions. No significant arthropathy. No radiopaque foreign bodies. IMPRESSION: No right ankle fracture or subluxation. Electronically Signed   By: Delbert PhenixJason A Poff M.D.   On: 02/21/2020 11:26   DG HIP UNILAT WITH PELVIS 2-3 VIEWS RIGHT  Result Date: 02/21/2020 CLINICAL DATA:  Fall today with right hip pain EXAM: DG HIP (WITH OR WITHOUT PELVIS) 2-3V RIGHT COMPARISON:  None.  FINDINGS: No pelvic fracture or diastasis. No right hip fracture or dislocation. Partially visualized bilateral posterior spinal fusion hardware in the lower lumbar spine. Mild osteoarthritis in the left greater than right hip joints. IMPRESSION: No fracture. No right hip dislocation. Mild left greater than right hip joint osteoarthritis. Electronically Signed   By: Delbert Phenix M.D.   On: 02/21/2020 11:29    Procedures Procedures (including critical care time)  Medications Ordered in ED Medications  sodium chloride 0.9 % bolus 500 mL (0 mLs Intravenous Stopped 02/21/20 1324)  acetaminophen (TYLENOL) tablet 1,000 mg (1,000 mg Oral Given 02/21/20 1000)  iohexol (OMNIPAQUE) 300 MG/ML solution 100 mL (100 mLs Intravenous Contrast Given 02/21/20 1115)  oxyCODONE (Oxy IR/ROXICODONE) immediate release tablet 5 mg (5 mg Oral Given 02/21/20 1330)    ED Course  I have reviewed the triage vital signs and the nursing notes.  Pertinent labs & imaging results that were available during my care of the patient were reviewed by me and considered in my medical decision making (see chart for details).    MDM Rules/Calculators/A&P                          Fall with multiple musculoskeletal complaints.  Will get imaging and labs hemodynamically stable.  I reviewed all the labs are reviewed all the plain film  imaging and CT imaging, radiologist reviewed imaging as well, no acute fracture or malalignment of the extremities, no intracranial abnormality no cervical spine abnormality.  Able to clear the patient's collar at bedside with no more tenderness to palpation and normal range of motion.  Chest abdomen pelvis was read as only chronic changes as well as an acute hematoma of the right subcutaneous fatty tissue.  Patient is counseled on all of these things as well as a pulmonary nodule that will need follow-up as an outpatient.  We will now attempt to ambulate the patient to make sure there are no other injuries that we could be missing.  Patient is able to ambulate, remains hemodynamically stable, does complain about right breast pain, no hematoma developing, the likely just contusion causing pain.  She will be given oxycodone for pain control, earlier she want to avoid narcotics but now that she is having some worsening pain she feels that she needs something stronger.  Repeat evaluation of CT scans by myself shows no big hematoma on the right compared to left.  She is likely safe for outpatient follow-up strict return precautions given vital signs stable time of discharge, over-the-counter pain medication recommended.   Final Clinical Impression(s) / ED Diagnoses Final diagnoses:  Fall  Hematoma of right hip, initial encounter    Rx / DC Orders ED Discharge Orders         Ordered    oxyCODONE (ROXICODONE) 5 MG immediate release tablet  Every 6 hours PRN        02/21/20 1441           Sabino Donovan, MD 02/21/20 1442

## 2020-02-21 NOTE — ED Notes (Signed)
Ambulated pt approximately 30 ft w/ no issue. Pt only complaining of R sided breast pain at this time. Dr. Myrtis Ser notified.

## 2020-04-02 DIAGNOSIS — Z01419 Encounter for gynecological examination (general) (routine) without abnormal findings: Secondary | ICD-10-CM | POA: Diagnosis not present

## 2020-04-02 DIAGNOSIS — Z90710 Acquired absence of both cervix and uterus: Secondary | ICD-10-CM | POA: Diagnosis not present

## 2020-04-13 DIAGNOSIS — Z1231 Encounter for screening mammogram for malignant neoplasm of breast: Secondary | ICD-10-CM | POA: Diagnosis not present

## 2020-04-21 DIAGNOSIS — E161 Other hypoglycemia: Secondary | ICD-10-CM | POA: Diagnosis not present

## 2020-04-21 DIAGNOSIS — Z299 Encounter for prophylactic measures, unspecified: Secondary | ICD-10-CM | POA: Diagnosis not present

## 2020-04-21 DIAGNOSIS — Z1339 Encounter for screening examination for other mental health and behavioral disorders: Secondary | ICD-10-CM | POA: Diagnosis not present

## 2020-04-21 DIAGNOSIS — Z789 Other specified health status: Secondary | ICD-10-CM | POA: Diagnosis not present

## 2020-04-21 DIAGNOSIS — R69 Illness, unspecified: Secondary | ICD-10-CM | POA: Diagnosis not present

## 2020-04-21 DIAGNOSIS — Z79899 Other long term (current) drug therapy: Secondary | ICD-10-CM | POA: Diagnosis not present

## 2020-04-21 DIAGNOSIS — E7800 Pure hypercholesterolemia, unspecified: Secondary | ICD-10-CM | POA: Diagnosis not present

## 2020-04-21 DIAGNOSIS — Z1331 Encounter for screening for depression: Secondary | ICD-10-CM | POA: Diagnosis not present

## 2020-04-21 DIAGNOSIS — Z0000 Encounter for general adult medical examination without abnormal findings: Secondary | ICD-10-CM | POA: Diagnosis not present

## 2020-04-21 DIAGNOSIS — Z7189 Other specified counseling: Secondary | ICD-10-CM | POA: Diagnosis not present

## 2020-04-21 DIAGNOSIS — Z6827 Body mass index (BMI) 27.0-27.9, adult: Secondary | ICD-10-CM | POA: Diagnosis not present

## 2020-06-28 DIAGNOSIS — R9082 White matter disease, unspecified: Secondary | ICD-10-CM | POA: Diagnosis not present

## 2020-06-28 DIAGNOSIS — M542 Cervicalgia: Secondary | ICD-10-CM | POA: Diagnosis not present

## 2020-06-28 DIAGNOSIS — S0990XA Unspecified injury of head, initial encounter: Secondary | ICD-10-CM | POA: Diagnosis not present

## 2020-06-28 DIAGNOSIS — M47812 Spondylosis without myelopathy or radiculopathy, cervical region: Secondary | ICD-10-CM | POA: Diagnosis not present

## 2020-06-28 DIAGNOSIS — M25562 Pain in left knee: Secondary | ICD-10-CM | POA: Diagnosis not present

## 2020-06-28 DIAGNOSIS — T1490XA Injury, unspecified, initial encounter: Secondary | ICD-10-CM | POA: Diagnosis not present

## 2020-06-28 DIAGNOSIS — Y92831 Amusement park as the place of occurrence of the external cause: Secondary | ICD-10-CM | POA: Diagnosis not present

## 2020-06-28 DIAGNOSIS — M25552 Pain in left hip: Secondary | ICD-10-CM | POA: Diagnosis not present

## 2020-06-28 DIAGNOSIS — R519 Headache, unspecified: Secondary | ICD-10-CM | POA: Diagnosis not present

## 2020-06-28 DIAGNOSIS — W010XXA Fall on same level from slipping, tripping and stumbling without subsequent striking against object, initial encounter: Secondary | ICD-10-CM | POA: Diagnosis not present

## 2020-08-13 DIAGNOSIS — R69 Illness, unspecified: Secondary | ICD-10-CM | POA: Diagnosis not present

## 2020-08-13 DIAGNOSIS — M549 Dorsalgia, unspecified: Secondary | ICD-10-CM | POA: Diagnosis not present

## 2020-08-13 DIAGNOSIS — M542 Cervicalgia: Secondary | ICD-10-CM | POA: Diagnosis not present

## 2020-08-13 DIAGNOSIS — M25562 Pain in left knee: Secondary | ICD-10-CM | POA: Diagnosis not present

## 2020-08-13 DIAGNOSIS — Z299 Encounter for prophylactic measures, unspecified: Secondary | ICD-10-CM | POA: Diagnosis not present

## 2020-10-06 DIAGNOSIS — Z0100 Encounter for examination of eyes and vision without abnormal findings: Secondary | ICD-10-CM | POA: Diagnosis not present

## 2020-10-06 DIAGNOSIS — H52229 Regular astigmatism, unspecified eye: Secondary | ICD-10-CM | POA: Diagnosis not present

## 2020-10-16 ENCOUNTER — Ambulatory Visit
Admission: EM | Admit: 2020-10-16 | Discharge: 2020-10-16 | Disposition: A | Discharge status: Home / Self Care | Payer: Medicare HMO | Attending: Emergency Medicine | Admitting: Emergency Medicine

## 2020-10-16 DIAGNOSIS — R0981 Nasal congestion: Secondary | ICD-10-CM | POA: Diagnosis not present

## 2020-10-16 DIAGNOSIS — Z7689 Persons encountering health services in other specified circumstances: Secondary | ICD-10-CM | POA: Diagnosis not present

## 2020-10-16 DIAGNOSIS — R059 Cough, unspecified: Secondary | ICD-10-CM

## 2020-10-16 MED ORDER — PREDNISONE 20 MG PO TABS: 20.0000 mg | ORAL_TABLET | Freq: Two times a day (BID) | ORAL | 0 refills | Status: AC

## 2020-10-16 MED ORDER — BENZONATATE 100 MG PO CAPS: 100.0000 mg | ORAL_CAPSULE | Freq: Three times a day (TID) | ORAL | 0 refills | Status: DC

## 2020-10-16 NOTE — Discharge Instructions (Addendum)
COVID testing ordered.  It will take between 5-7 days for test results.  Someone will contact you regarding abnormal results.    In the meantime: You should remain isolated in your home for 5 days from symptom onset AND greater than 72 hours after symptoms resolution (absence of fever without the use of fever-reducing medication and improvement in respiratory symptoms), whichever is longer Get plenty of rest and push fluids Tessalon Perles prescribed for cough Prednisone for congestion Use OTC zyrtec for nasal congestion, runny nose, and/or sore throat Use OTC flonase for nasal congestion and runny nose Use medications daily for symptom relief Use OTC medications like ibuprofen or tylenol as needed fever or pain Call or go to the ED if you have any new or worsening symptoms such as fever, worsening cough, shortness of breath, chest tightness, chest pain, turning blue, changes in mental status, etc..Marland Kitchen

## 2020-10-16 NOTE — ED Provider Notes (Signed)
[facility]   [id number] 10/16/20 Arrival Time: 1024   CC: COVID symptoms  SUBJECTIVE: History from: patient.  Savannah Rocha is a 65 y.o. female who presents with sinus pain, pressure, congestion, slight cough x 2 days.  Denies sick exposure to COVID, flu or strep.  Daughter with similar symptoms.  Has tried OTC medications without relief.  Denies aggravating factors.  Denies previous symptoms in the past.   Denies fever, chills, SOB, wheezing, chest pain, nausea, changes in bowel or bladder habits.     ROS: As per HPI.  All other pertinent ROS negative.     Past Medical History:  Diagnosis Date   Arthritis    Chronic lower back pain    Depression    GERD (gastroesophageal reflux disease)    Hyperlipidemia    Migraine without aura, without mention of intractable migraine without mention of status migrainosus 11/05/2013   Migraines    last migraine 2 days ago   Past Surgical History:  Procedure Laterality Date   ABDOMINAL HYSTERECTOMY     CATARACT EXTRACTION W/PHACO  01/16/2012   Procedure: CATARACT EXTRACTION PHACO AND INTRAOCULAR LENS PLACEMENT (IOC);  Surgeon: Loraine Leriche T. Nile Riggs, MD;  Location: [facility];  Service: Ophthalmology;  Laterality: Right;  CDE:6.42   CATARACT EXTRACTION W/PHACO  01/30/2012   Procedure: CATARACT EXTRACTION PHACO AND INTRAOCULAR LENS PLACEMENT (IOC);  Surgeon: Loraine Leriche T. Nile Riggs, MD;  Location: [facility];  Service: Ophthalmology;  Laterality: Left;  CDE:8.36   HEMORRHOID SURGERY     lower back      NECK SURGERY     benign tumor removed    REFRACTIVE SURGERY     right knee  1993   TUBAL LIGATION     YAG LASER APPLICATION Right 08/27/2012   Procedure: YAG LASER APPLICATION;  Surgeon: Loraine Leriche T. Nile Riggs, MD;  Location: [facility];  Service: Ophthalmology;  Laterality: Right;   YAG LASER APPLICATION Left 09/24/2012   Procedure: YAG LASER APPLICATION;  Surgeon: Loraine Leriche T. Nile Riggs, MD;  Location: [facility];  Service: Ophthalmology;  Laterality: Left;   No Known  Allergies No current facility-administered medications on file prior to encounter.   Current Outpatient Medications on File Prior to Encounter  Medication Sig Dispense Refill   calcium carbonate (OS-CAL) 600 MG TABS tablet Take 600 mg by mouth.     cyclobenzaprine (FLEXERIL) 10 MG tablet Take 1 tablet (10 mg total) by mouth at bedtime. 20 tablet 0   fluticasone (FLONASE) 50 MCG/ACT nasal spray Place 2 sprays into both nostrils daily. 16 g 0   gemfibrozil (LOPID) 600 MG tablet Take 600 mg by mouth 2 (two) times daily.     ibuprofen (ADVIL) 600 MG tablet Take 1 tablet (600 mg total) by mouth every 6 (six) hours as needed. 30 tablet 0   omeprazole (PRILOSEC) 20 MG capsule Take 1 capsule (20 mg total) by mouth daily. 90 capsule 1   oxyCODONE (ROXICODONE) 5 MG immediate release tablet Take 1 tablet (5 mg total) by mouth every 6 (six) hours as needed for up to 12 doses for severe pain. 12 tablet 0   promethazine (PHENERGAN) 25 MG suppository Place 1 suppository (25 mg total) rectally every 6 (six) hours as needed for nausea or vomiting. 12 each 0   simvastatin (ZOCOR) 40 MG tablet Take 1 tablet (40 mg total) by mouth daily. 30 tablet 0   SUMAtriptan (IMITREX) 100 MG tablet Take 1 tablet (100 mg total) by mouth as needed for migraine. 10 tablet  6   venlafaxine XR (EFFEXOR-XR) 150 MG 24 hr capsule Take 150 mg by mouth daily with breakfast.     [DISCONTINUED] propranolol (INDERAL) 10 MG tablet TAKE 2 TABLETS BY MOUTH TWICE DAILY 120 tablet 0   Social History   Socioeconomic History   Marital status: Legally Separated    Spouse name: Not on file   Number of children: 3   Years of education: 11   Highest education level: Not on file  Occupational History   Not on file  Tobacco Use   Smoking status: Never   Smokeless tobacco: Never  Substance and Sexual Activity   Alcohol use: No   Drug use: No   Sexual activity: Not on file  Other Topics Concern   Not on file  Social History Narrative    Pts husband passed away with cancer this last week.  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   Social Determinants of Corporate investment banker Strain: Not on file  Food Insecurity: Not on file  Transportation Needs: Not on file  Physical Activity: Not on file  Stress: Not on file  Social Connections: Not on file  Intimate Partner Violence: Not on file   Family History  Problem Relation Age of Onset   Cancer Father        lung   Depression Sister    Hypertension Sister    Hyperlipidemia Sister    Depression Sister    Heart disease Brother    COPD Brother    Cancer Brother     OBJECTIVE:  Vitals:   10/16/20 1154  BP: 126/81  Pulse: 88  Resp: 18  Temp: 98.6 F (37 C)  TempSrc: Oral  SpO2: 97%    General appearance: alert; well-appearing, nontoxic; speaking in full sentences and tolerating own secretions HEENT: NCAT; Ears: EACs clear, TMs pearly gray; Eyes: PERRL.  EOM grossly intact.Nose: nares patent without rhinorrhea, Throat: oropharynx clear, tonsils non erythematous or enlarged, uvula midline  Neck: supple without LAD Lungs: unlabored respirations, symmetrical air entry; cough: absent; no respiratory distress; CTAB Heart: regular rate and rhythm.  Skin: warm and dry Psychological: alert and cooperative; normal mood and affect   ASSESSMENT & PLAN:  1. Sinus congestion   2. Cough     Meds ordered this encounter  Medications   predniSONE (DELTASONE) 20 MG tablet    Sig: Take 1 tablet (20 mg total) by mouth 2 (two) times daily with a meal for 5 days.    Dispense:  10 tablet    Refill:  0    Order Specific Question:   Supervising Provider    Answer:   Eustace Moore [[id number]]   benzonatate (TESSALON) 100 MG capsule    Sig: Take 1 capsule (100 mg total) by mouth every 8 (eight) hours.    Dispense:  21 capsule    Refill:  0    Order Specific Question:   Supervising Provider    Answer:   Eustace Moore [[id number]]     COVID testing ordered.  It will take between 5-7 days  for test results.  Someone will contact you regarding abnormal results.    In the meantime: You should remain isolated in your home for 5 days from symptom onset AND greater than 72 hours after symptoms resolution (absence of fever without the use of fever-reducing medication and improvement in respiratory symptoms), whichever is longer Get plenty of rest and push fluids Tessalon Perles prescribed for cough Prednisone for congestion Use OTC zyrtec for  nasal congestion, runny nose, and/or sore throat Use OTC flonase for nasal congestion and runny nose Use medications daily for symptom relief Use OTC medications like ibuprofen or tylenol as needed fever or pain Call or go to the ED if you have any new or worsening symptoms such as fever, worsening cough, shortness of breath, chest tightness, chest pain, turning blue, changes in mental status, etc...   Reviewed expectations re: course of current medical issues. Questions answered. Outlined signs and symptoms indicating need for more acute intervention. Patient verbalized understanding. After Visit Summary given.          Rennis Harding, PA-C 10/16/20 1206

## 2020-10-16 NOTE — ED Triage Notes (Signed)
Pt present coughing and bilateral ear pain wit congestion. Symptom started on Wednesday.

## 2020-10-17 LAB — NOVEL CORONAVIRUS, NAA: SARS-CoV-2, NAA: DETECTED — AB

## 2020-10-17 LAB — SARS-COV-2, NAA 2 DAY TAT

## 2020-10-18 DIAGNOSIS — I739 Peripheral vascular disease, unspecified: Secondary | ICD-10-CM | POA: Diagnosis not present

## 2020-10-18 DIAGNOSIS — U071 COVID-19: Secondary | ICD-10-CM | POA: Diagnosis not present

## 2020-10-18 DIAGNOSIS — R69 Illness, unspecified: Secondary | ICD-10-CM | POA: Diagnosis not present

## 2020-10-18 DIAGNOSIS — Z299 Encounter for prophylactic measures, unspecified: Secondary | ICD-10-CM | POA: Diagnosis not present

## 2021-03-10 DIAGNOSIS — R911 Solitary pulmonary nodule: Secondary | ICD-10-CM | POA: Diagnosis not present

## 2021-03-10 DIAGNOSIS — R918 Other nonspecific abnormal finding of lung field: Secondary | ICD-10-CM | POA: Diagnosis not present

## 2021-03-10 DIAGNOSIS — I700 Atherosclerosis of aorta: Secondary | ICD-10-CM | POA: Diagnosis not present

## 2021-04-18 DIAGNOSIS — Z6827 Body mass index (BMI) 27.0-27.9, adult: Secondary | ICD-10-CM | POA: Diagnosis not present

## 2021-04-18 DIAGNOSIS — J069 Acute upper respiratory infection, unspecified: Secondary | ICD-10-CM | POA: Diagnosis not present

## 2021-04-18 DIAGNOSIS — R079 Chest pain, unspecified: Secondary | ICD-10-CM | POA: Diagnosis not present

## 2021-04-18 DIAGNOSIS — Z299 Encounter for prophylactic measures, unspecified: Secondary | ICD-10-CM | POA: Diagnosis not present

## 2021-04-18 DIAGNOSIS — J40 Bronchitis, not specified as acute or chronic: Secondary | ICD-10-CM | POA: Diagnosis not present

## 2021-04-26 DIAGNOSIS — Z1339 Encounter for screening examination for other mental health and behavioral disorders: Secondary | ICD-10-CM | POA: Diagnosis not present

## 2021-04-26 DIAGNOSIS — Z1331 Encounter for screening for depression: Secondary | ICD-10-CM | POA: Diagnosis not present

## 2021-04-26 DIAGNOSIS — Z0000 Encounter for general adult medical examination without abnormal findings: Secondary | ICD-10-CM | POA: Diagnosis not present

## 2021-04-26 DIAGNOSIS — E7800 Pure hypercholesterolemia, unspecified: Secondary | ICD-10-CM | POA: Diagnosis not present

## 2021-04-26 DIAGNOSIS — R350 Frequency of micturition: Secondary | ICD-10-CM | POA: Diagnosis not present

## 2021-04-26 DIAGNOSIS — Z299 Encounter for prophylactic measures, unspecified: Secondary | ICD-10-CM | POA: Diagnosis not present

## 2021-04-26 DIAGNOSIS — R69 Illness, unspecified: Secondary | ICD-10-CM | POA: Diagnosis not present

## 2021-04-26 DIAGNOSIS — Z6827 Body mass index (BMI) 27.0-27.9, adult: Secondary | ICD-10-CM | POA: Diagnosis not present

## 2021-04-26 DIAGNOSIS — Z79899 Other long term (current) drug therapy: Secondary | ICD-10-CM | POA: Diagnosis not present

## 2021-04-26 DIAGNOSIS — F339 Major depressive disorder, recurrent, unspecified: Secondary | ICD-10-CM | POA: Diagnosis not present

## 2021-04-26 DIAGNOSIS — Z7189 Other specified counseling: Secondary | ICD-10-CM | POA: Diagnosis not present

## 2021-05-12 DIAGNOSIS — Z1231 Encounter for screening mammogram for malignant neoplasm of breast: Secondary | ICD-10-CM | POA: Diagnosis not present

## 2021-08-05 DIAGNOSIS — R5383 Other fatigue: Secondary | ICD-10-CM | POA: Diagnosis not present

## 2021-08-05 DIAGNOSIS — E538 Deficiency of other specified B group vitamins: Secondary | ICD-10-CM | POA: Diagnosis not present

## 2021-08-05 DIAGNOSIS — Z299 Encounter for prophylactic measures, unspecified: Secondary | ICD-10-CM | POA: Diagnosis not present

## 2021-08-05 DIAGNOSIS — E559 Vitamin D deficiency, unspecified: Secondary | ICD-10-CM | POA: Diagnosis not present

## 2021-08-05 DIAGNOSIS — R42 Dizziness and giddiness: Secondary | ICD-10-CM | POA: Diagnosis not present

## 2021-08-27 DIAGNOSIS — G4730 Sleep apnea, unspecified: Secondary | ICD-10-CM | POA: Diagnosis not present

## 2021-09-29 DIAGNOSIS — R5383 Other fatigue: Secondary | ICD-10-CM | POA: Diagnosis not present

## 2021-09-29 DIAGNOSIS — J069 Acute upper respiratory infection, unspecified: Secondary | ICD-10-CM | POA: Diagnosis not present

## 2021-09-29 DIAGNOSIS — Z299 Encounter for prophylactic measures, unspecified: Secondary | ICD-10-CM | POA: Diagnosis not present

## 2021-11-04 DIAGNOSIS — R5383 Other fatigue: Secondary | ICD-10-CM | POA: Diagnosis not present

## 2021-11-04 DIAGNOSIS — G4733 Obstructive sleep apnea (adult) (pediatric): Secondary | ICD-10-CM | POA: Diagnosis not present

## 2021-11-04 DIAGNOSIS — R0683 Snoring: Secondary | ICD-10-CM | POA: Diagnosis not present

## 2021-12-04 DIAGNOSIS — R0683 Snoring: Secondary | ICD-10-CM | POA: Diagnosis not present

## 2021-12-04 DIAGNOSIS — R5383 Other fatigue: Secondary | ICD-10-CM | POA: Diagnosis not present

## 2021-12-04 DIAGNOSIS — G4733 Obstructive sleep apnea (adult) (pediatric): Secondary | ICD-10-CM | POA: Diagnosis not present

## 2021-12-19 DIAGNOSIS — R5383 Other fatigue: Secondary | ICD-10-CM | POA: Diagnosis not present

## 2021-12-19 DIAGNOSIS — G4730 Sleep apnea, unspecified: Secondary | ICD-10-CM | POA: Diagnosis not present

## 2022-01-04 DIAGNOSIS — G4733 Obstructive sleep apnea (adult) (pediatric): Secondary | ICD-10-CM | POA: Diagnosis not present

## 2022-01-04 DIAGNOSIS — R0683 Snoring: Secondary | ICD-10-CM | POA: Diagnosis not present

## 2022-01-04 DIAGNOSIS — R5383 Other fatigue: Secondary | ICD-10-CM | POA: Diagnosis not present

## 2022-01-28 DIAGNOSIS — R0683 Snoring: Secondary | ICD-10-CM | POA: Diagnosis not present

## 2022-01-28 DIAGNOSIS — G4733 Obstructive sleep apnea (adult) (pediatric): Secondary | ICD-10-CM | POA: Diagnosis not present

## 2022-01-28 DIAGNOSIS — R5383 Other fatigue: Secondary | ICD-10-CM | POA: Diagnosis not present

## 2022-02-03 DIAGNOSIS — R0683 Snoring: Secondary | ICD-10-CM | POA: Diagnosis not present

## 2022-02-03 DIAGNOSIS — R5383 Other fatigue: Secondary | ICD-10-CM | POA: Diagnosis not present

## 2022-02-03 DIAGNOSIS — G4733 Obstructive sleep apnea (adult) (pediatric): Secondary | ICD-10-CM | POA: Diagnosis not present

## 2022-02-10 DIAGNOSIS — J069 Acute upper respiratory infection, unspecified: Secondary | ICD-10-CM | POA: Diagnosis not present

## 2022-02-10 DIAGNOSIS — R0981 Nasal congestion: Secondary | ICD-10-CM | POA: Diagnosis not present

## 2022-03-06 DIAGNOSIS — G4733 Obstructive sleep apnea (adult) (pediatric): Secondary | ICD-10-CM | POA: Diagnosis not present

## 2022-03-06 DIAGNOSIS — R5383 Other fatigue: Secondary | ICD-10-CM | POA: Diagnosis not present

## 2022-03-06 DIAGNOSIS — R0683 Snoring: Secondary | ICD-10-CM | POA: Diagnosis not present

## 2022-04-06 DIAGNOSIS — G4733 Obstructive sleep apnea (adult) (pediatric): Secondary | ICD-10-CM | POA: Diagnosis not present

## 2022-04-06 DIAGNOSIS — R0683 Snoring: Secondary | ICD-10-CM | POA: Diagnosis not present

## 2022-04-06 DIAGNOSIS — R5383 Other fatigue: Secondary | ICD-10-CM | POA: Diagnosis not present

## 2022-04-21 DIAGNOSIS — I700 Atherosclerosis of aorta: Secondary | ICD-10-CM | POA: Diagnosis not present

## 2022-04-21 DIAGNOSIS — Z7189 Other specified counseling: Secondary | ICD-10-CM | POA: Diagnosis not present

## 2022-04-21 DIAGNOSIS — Z1211 Encounter for screening for malignant neoplasm of colon: Secondary | ICD-10-CM | POA: Diagnosis not present

## 2022-04-21 DIAGNOSIS — Z0000 Encounter for general adult medical examination without abnormal findings: Secondary | ICD-10-CM | POA: Diagnosis not present

## 2022-04-21 DIAGNOSIS — Z1339 Encounter for screening examination for other mental health and behavioral disorders: Secondary | ICD-10-CM | POA: Diagnosis not present

## 2022-04-21 DIAGNOSIS — R69 Illness, unspecified: Secondary | ICD-10-CM | POA: Diagnosis not present

## 2022-04-21 DIAGNOSIS — Z1331 Encounter for screening for depression: Secondary | ICD-10-CM | POA: Diagnosis not present

## 2022-04-21 DIAGNOSIS — E7800 Pure hypercholesterolemia, unspecified: Secondary | ICD-10-CM | POA: Diagnosis not present

## 2022-04-21 DIAGNOSIS — Z299 Encounter for prophylactic measures, unspecified: Secondary | ICD-10-CM | POA: Diagnosis not present

## 2022-04-21 DIAGNOSIS — F339 Major depressive disorder, recurrent, unspecified: Secondary | ICD-10-CM | POA: Diagnosis not present

## 2022-04-21 DIAGNOSIS — Z6828 Body mass index (BMI) 28.0-28.9, adult: Secondary | ICD-10-CM | POA: Diagnosis not present

## 2022-05-05 DIAGNOSIS — G4733 Obstructive sleep apnea (adult) (pediatric): Secondary | ICD-10-CM | POA: Diagnosis not present

## 2022-05-05 DIAGNOSIS — R5383 Other fatigue: Secondary | ICD-10-CM | POA: Diagnosis not present

## 2022-05-05 DIAGNOSIS — R0683 Snoring: Secondary | ICD-10-CM | POA: Diagnosis not present

## 2022-05-19 DIAGNOSIS — Z1231 Encounter for screening mammogram for malignant neoplasm of breast: Secondary | ICD-10-CM | POA: Diagnosis not present

## 2022-06-05 DIAGNOSIS — G4733 Obstructive sleep apnea (adult) (pediatric): Secondary | ICD-10-CM | POA: Diagnosis not present

## 2022-06-05 DIAGNOSIS — R0683 Snoring: Secondary | ICD-10-CM | POA: Diagnosis not present

## 2022-06-05 DIAGNOSIS — R5383 Other fatigue: Secondary | ICD-10-CM | POA: Diagnosis not present

## 2022-06-16 DIAGNOSIS — Z299 Encounter for prophylactic measures, unspecified: Secondary | ICD-10-CM | POA: Diagnosis not present

## 2022-06-16 DIAGNOSIS — M545 Low back pain, unspecified: Secondary | ICD-10-CM | POA: Diagnosis not present

## 2022-06-16 DIAGNOSIS — R52 Pain, unspecified: Secondary | ICD-10-CM | POA: Diagnosis not present

## 2022-06-16 DIAGNOSIS — M25551 Pain in right hip: Secondary | ICD-10-CM | POA: Diagnosis not present

## 2022-06-20 DIAGNOSIS — H9202 Otalgia, left ear: Secondary | ICD-10-CM | POA: Diagnosis not present

## 2022-06-20 DIAGNOSIS — J329 Chronic sinusitis, unspecified: Secondary | ICD-10-CM | POA: Diagnosis not present

## 2022-06-23 DIAGNOSIS — H524 Presbyopia: Secondary | ICD-10-CM | POA: Diagnosis not present

## 2022-06-30 DIAGNOSIS — Z299 Encounter for prophylactic measures, unspecified: Secondary | ICD-10-CM | POA: Diagnosis not present

## 2022-06-30 DIAGNOSIS — E7800 Pure hypercholesterolemia, unspecified: Secondary | ICD-10-CM | POA: Diagnosis not present

## 2022-06-30 DIAGNOSIS — R5383 Other fatigue: Secondary | ICD-10-CM | POA: Diagnosis not present

## 2022-06-30 DIAGNOSIS — Z0000 Encounter for general adult medical examination without abnormal findings: Secondary | ICD-10-CM | POA: Diagnosis not present

## 2022-06-30 DIAGNOSIS — Z79899 Other long term (current) drug therapy: Secondary | ICD-10-CM | POA: Diagnosis not present

## 2022-07-05 DIAGNOSIS — G4733 Obstructive sleep apnea (adult) (pediatric): Secondary | ICD-10-CM | POA: Diagnosis not present

## 2022-07-05 DIAGNOSIS — R5383 Other fatigue: Secondary | ICD-10-CM | POA: Diagnosis not present

## 2022-07-05 DIAGNOSIS — R0683 Snoring: Secondary | ICD-10-CM | POA: Diagnosis not present

## 2022-07-06 DIAGNOSIS — Z299 Encounter for prophylactic measures, unspecified: Secondary | ICD-10-CM | POA: Diagnosis not present

## 2022-07-06 DIAGNOSIS — M25551 Pain in right hip: Secondary | ICD-10-CM | POA: Diagnosis not present

## 2022-07-17 ENCOUNTER — Encounter: Payer: Self-pay | Admitting: Cardiology

## 2022-07-17 NOTE — Progress Notes (Unsigned)
Cardiology Office Note  Date: 07/18/2022   ID: Savannah Rocha, DOB November 11, 1955, MRN [id number]  PCP: Ignatius Specking, MD  Chief Complaint:  Chief Complaint  Patient presents with   Chest discomfort   History of Present Illness: Savannah Rocha is a 67 y.o. female referred for cardiology consultation by Dr. Sherril Croon for further evaluation of atherosclerosis evident by CT imaging. Chest CT obtained at [facility] in January 2023 revealed no acute intrathoracic abnormalities with stable small pulmonary nodules up to 5 mm and coronary (LAD distribution) as well as aortic calcification.  She has no known history of obstructive CAD, has not undergone any prior cardiac structural or ischemic testing.  As far as symptoms are concerned, her main complaint is of a sporadic sense of lightheadedness, can occur either seated or standing, no obvious precipitant, no frank syncope.  Sometimes states that she feels like she needs to have something to drink when this happens.  She also feels intermittent palpitations, but not necessarily with lightheadedness.  Furthermore, she describes a feeling of "pressure" on the left side of her chest, this is also sporadic, not necessarily precipitated by exertion, and not specifically associated with lightheadedness or palpitations.  I reviewed her medications.  She has been on aspirin and Zocor consistently.  Recent LDL 108 per review of outside lab work.  ECG today shows normal sinus rhythm.  Review of Systems: As outlined in the history of present illness.  Past Medical History: Past Medical History:  Diagnosis Date   Arthritis    Atherosclerosis    Chronic lower back pain    Depression    GERD (gastroesophageal reflux disease)    Hyperlipidemia    Migraines    Past Surgical History: Past Surgical History:  Procedure Laterality Date   ABDOMINAL HYSTERECTOMY     CATARACT EXTRACTION W/PHACO  01/16/2012   Procedure: CATARACT EXTRACTION PHACO AND  INTRAOCULAR LENS PLACEMENT (IOC);  Surgeon: Loraine Leriche T. Nile Riggs, MD;  Location: [facility];  Service: Ophthalmology;  Laterality: Right;  CDE:6.42   CATARACT EXTRACTION W/PHACO  01/30/2012   Procedure: CATARACT EXTRACTION PHACO AND INTRAOCULAR LENS PLACEMENT (IOC);  Surgeon: Loraine Leriche T. Nile Riggs, MD;  Location: [facility];  Service: Ophthalmology;  Laterality: Left;  CDE:8.36   HEMORRHOID SURGERY     lower back      NECK SURGERY     benign tumor removed    REFRACTIVE SURGERY     right knee  1993   TUBAL LIGATION     YAG LASER APPLICATION Right 08/27/2012   Procedure: YAG LASER APPLICATION;  Surgeon: Loraine Leriche T. Nile Riggs, MD;  Location: [facility];  Service: Ophthalmology;  Laterality: Right;   YAG LASER APPLICATION Left 09/24/2012   Procedure: YAG LASER APPLICATION;  Surgeon: Loraine Leriche T. Nile Riggs, MD;  Location: [facility];  Service: Ophthalmology;  Laterality: Left;   Family History: Family History  Problem Relation Age of Onset   Cancer Father        lung   Depression Sister    Hypertension Sister    Hyperlipidemia Sister    Depression Sister    Heart disease Brother    COPD Brother    Cancer Brother    Social History:  Social History   Tobacco Use   Smoking status: Never   Smokeless tobacco: Never  Substance Use Topics   Alcohol use: No   Medications: Current Outpatient Medications on File Prior to Visit  Medication Sig Dispense Refill   aspirin EC 81 MG tablet Take  81 mg by mouth daily.     baclofen (LIORESAL) 10 MG tablet Take 10 mg by mouth daily.     calcium carbonate (OS-CAL) 600 MG TABS tablet Take 600 mg by mouth.     celecoxib (CELEBREX) 200 MG capsule Take 200 mg by mouth daily.     fluticasone (FLONASE) 50 MCG/ACT nasal spray Place 2 sprays into both nostrils daily. 16 g 0   gabapentin (NEURONTIN) 100 MG capsule Take 100 mg by mouth daily.     gemfibrozil (LOPID) 600 MG tablet Take 600 mg by mouth 2 (two) times daily.     ibuprofen (ADVIL) 600 MG tablet Take 1 tablet (600 mg total) by mouth  every 6 (six) hours as needed. 30 tablet 0   omeprazole (PRILOSEC) 20 MG capsule Take 1 capsule (20 mg total) by mouth daily. 90 capsule 1   promethazine (PHENERGAN) 25 MG suppository Place 1 suppository (25 mg total) rectally every 6 (six) hours as needed for nausea or vomiting. 12 each 0   simvastatin (ZOCOR) 40 MG tablet Take 1 tablet (40 mg total) by mouth daily. 30 tablet 0   SUMAtriptan (IMITREX) 100 MG tablet Take 1 tablet (100 mg total) by mouth as needed for migraine. 10 tablet 6   traZODone (DESYREL) 100 MG tablet Take 1 tablet by mouth at bedtime.     venlafaxine XR (EFFEXOR-XR) 150 MG 24 hr capsule Take 150 mg by mouth daily with breakfast.     [DISCONTINUED] propranolol (INDERAL) 10 MG tablet TAKE 2 TABLETS BY MOUTH TWICE DAILY 120 tablet 0   No current facility-administered medications on file prior to visit.   Allergies: No Known Allergies Physical Exam: VS:  BP 110/68   Pulse 87   Ht 5\' 5"  (1.651 m)   Wt 170 lb 6.4 oz (77.3 kg)   SpO2 95%   BMI 28.36 kg/m , BMI Body mass index is 28.36 kg/m.  Wt Readings from Last 3 Encounters:  07/18/22 170 lb 6.4 oz (77.3 kg)  02/21/20 150 lb (68 kg)  10/20/15 161 lb 6.4 oz (73.2 kg)    General: Patient appears comfortable at rest. HEENT: Conjunctiva and lids normal. Neck: Supple, no elevated JVP or carotid bruits. Lungs: Clear to auscultation, nonlabored breathing at rest. Cardiac: Regular rate and rhythm, no S3 or significant systolic murmur, no pericardial rub. Abdomen: Soft, nontender, bowel sounds present. Extremities: No pitting edema.  ECG:  An ECG dated 02/21/2020 was personally reviewed today and demonstrated:  Sinus rhythm with nonspecific ST changes.  Labwork:  April 2024: Hemoglobin 13.8, platelets 170, BUN 12, creatinine 0.69, potassium 4.8, AST 20, ALT 19, cholesterol 188, triglycerides 177, HDL 49, LDL 108, TSH 0.651.  Other Studies Reviewed Today:  No prior cardiac testing available for review  today.  Assessment and Plan:  1.  Coronary artery calcification evident by routine chest CT done at [facility] in January 2023 as outlined above.  She reports fairly sporadic symptoms as discussed above including left-sided chest pressure which is not specifically exertional, intermittent palpitations, and intermittent lightheadedness without syncope.  Baseline ECG is normal.  She does have some ambulatory limitations with prior history of lumbar spine surgery and recurrent right hip pain.  Plan is to proceed with cardiac structural and ischemic evaluation in the form of an echocardiogram and Lexiscan Myoview.  Will also arrange to place a 7-day ZIO monitor to investigate cardiac rhythm.  For now would continue aspirin and statin therapy.  2.  Mixed hyperlipidemia, LDL  108 recently on Zocor 40 mg daily.  Suggest switch to Crestor 20 mg and uptitrate to get LDL closer to goal in the setting of atherosclerosis.  Disposition:  Follow up  test results.  Signed, Jonelle Sidle, M.D., F.A.C.C. [facility]

## 2022-07-18 ENCOUNTER — Ambulatory Visit: Payer: Medicare HMO | Attending: Cardiology

## 2022-07-18 ENCOUNTER — Ambulatory Visit: Payer: Medicare HMO | Attending: Cardiology | Admitting: Cardiology

## 2022-07-18 ENCOUNTER — Encounter: Payer: Self-pay | Admitting: Cardiology

## 2022-07-18 VITALS — BP 110/68 | HR 87 | Ht 65.0 in | Wt 170.4 lb

## 2022-07-18 DIAGNOSIS — R002 Palpitations: Secondary | ICD-10-CM

## 2022-07-18 DIAGNOSIS — I2510 Atherosclerotic heart disease of native coronary artery without angina pectoris: Secondary | ICD-10-CM

## 2022-07-18 DIAGNOSIS — R42 Dizziness and giddiness: Secondary | ICD-10-CM | POA: Diagnosis not present

## 2022-07-18 DIAGNOSIS — E782 Mixed hyperlipidemia: Secondary | ICD-10-CM

## 2022-07-18 DIAGNOSIS — R0789 Other chest pain: Secondary | ICD-10-CM

## 2022-07-18 MED ORDER — ROSUVASTATIN CALCIUM 20 MG PO TABS: 20.0000 mg | ORAL_TABLET | Freq: Every day | ORAL | 3 refills | Status: DC

## 2022-07-18 NOTE — Patient Instructions (Addendum)
Medication Instructions:   STOP Zocor  START Crestor 20 mg daily at bedtime  Labwork: None today  Testing/Procedures: Your physician has requested that you have an echocardiogram. Echocardiography is a painless test that uses sound waves to create images of your heart. It provides your doctor with information about the size and shape of your heart and how well your heart's chambers and valves are working. This procedure takes approximately one hour. There are no restrictions for this procedure. Please do NOT wear cologne, perfume, aftershave, or lotions (deodorant is allowed). Please arrive 15 minutes prior to your appointment time.  Your physician has requested that you have a lexiscan myoview. For further information please visit [email or web address] Please follow instruction sheet, as given.   ZIO XT- Long Term Monitor Instructions   Your physician has requested you wear your ZIO patch monitor_____7__days.   This is a single patch monitor.  [employer] supplies one patch monitor per enrollment.  Additional stickers are not available.   Please do not apply patch if you will be having a Nuclear Stress Test, Echocardiogram, Cardiac CT, MRI, or Chest Xray during the time frame you would be wearing the monitor. The patch cannot be worn during these tests.  You cannot remove and re-apply the ZIO XT patch monitor.     Do not shower for the first 24 hours.  You may shower after the first 24 hours.   Press button if you feel a symptom. You will hear a small click.  Record Date, Time and Symptom in the Patient Log Book.   When you are ready to remove patch, follow instructions on last 2 pages of Patient Log Book.  Stick patch monitor onto last page of Patient Log Book.   Place Patient Log Book in [place] box.  Use locking tab on box and tape box closed securely.  The [employer] and [employer] has [employer] on it.  Please place in mailbox as soon as possible.  Your physician should have your test  results approximately 7 days after the monitor has been mailed back to [facility].   Call [facility] Customer Care at [phone] if you have questions regarding your ZIO XT patch monitor.  Call them immediately if you see an orange light blinking on your monitor.   If your monitor falls off in less than 4 days contact our Monitor department at [phone].  If your monitor becomes loose or falls off after 4 days call [employer] at [phone] for suggestions on securing your monitor.    Follow-Up: We will call you with results  Any Other Special Instructions Will Be Listed Below (If Applicable).  If you need a refill on your cardiac medications before your next appointment, please call your pharmacy.

## 2022-07-28 ENCOUNTER — Inpatient Hospital Stay (HOSPITAL_COMMUNITY): Admission: RE | Admit: 2022-07-28 | Payer: Medicare HMO | Source: Ambulatory Visit

## 2022-07-28 ENCOUNTER — Encounter (HOSPITAL_COMMUNITY)
Admission: RE | Admit: 2022-07-28 | Discharge: 2022-07-28 | Disposition: A | Discharge status: Home / Self Care | Payer: Medicare HMO | Source: Ambulatory Visit | Attending: Cardiology | Admitting: Cardiology

## 2022-07-28 DIAGNOSIS — R0789 Other chest pain: Secondary | ICD-10-CM | POA: Insufficient documentation

## 2022-07-28 DIAGNOSIS — I2510 Atherosclerotic heart disease of native coronary artery without angina pectoris: Secondary | ICD-10-CM | POA: Insufficient documentation

## 2022-07-28 LAB — NM MYOCAR MULTI W/SPECT W/WALL MOTION / EF
LV dias vol: 53 mL (ref 46–106)
LV sys vol: 15 mL
Nuc Stress EF: 71 %
Peak HR: 116 {beats}/min
RATE: 0.7
Rest HR: 75 {beats}/min
Rest Nuclear Isotope Dose: 10.1 mCi
SDS: 0
SRS: 1
SSS: 1
ST Depression (mm): 0 mm
Stress Nuclear Isotope Dose: 29 mCi
TID: 1.41

## 2022-07-28 MED ORDER — TECHNETIUM TC 99M TETROFOSMIN IV KIT
10.1000 | PACK | Freq: Once | INTRAVENOUS | Status: AC | PRN
  Administered 2022-07-28: 10.1 via INTRAVENOUS

## 2022-07-28 MED ORDER — TECHNETIUM TC 99M TETROFOSMIN IV KIT
29.0000 | PACK | Freq: Once | INTRAVENOUS | Status: AC | PRN
  Administered 2022-07-28: 29 via INTRAVENOUS

## 2022-07-28 MED ORDER — REGADENOSON 0.4 MG/5ML IV SOLN
INTRAVENOUS | Status: AC
  Administered 2022-07-28: 0.4 mg
  Filled 2022-07-28: qty 5

## 2022-07-28 MED ORDER — SODIUM CHLORIDE FLUSH 0.9 % IV SOLN
INTRAVENOUS | Status: AC
  Administered 2022-07-28: 10 mL via INTRAVENOUS
  Filled 2022-07-28: qty 10

## 2022-07-31 DIAGNOSIS — R002 Palpitations: Secondary | ICD-10-CM | POA: Diagnosis not present

## 2022-07-31 DIAGNOSIS — R42 Dizziness and giddiness: Secondary | ICD-10-CM | POA: Diagnosis not present

## 2022-08-05 DIAGNOSIS — R0683 Snoring: Secondary | ICD-10-CM | POA: Diagnosis not present

## 2022-08-05 DIAGNOSIS — G4733 Obstructive sleep apnea (adult) (pediatric): Secondary | ICD-10-CM | POA: Diagnosis not present

## 2022-08-05 DIAGNOSIS — R5383 Other fatigue: Secondary | ICD-10-CM | POA: Diagnosis not present

## 2022-08-07 DIAGNOSIS — R42 Dizziness and giddiness: Secondary | ICD-10-CM | POA: Diagnosis not present

## 2022-08-07 DIAGNOSIS — R002 Palpitations: Secondary | ICD-10-CM | POA: Diagnosis not present

## 2022-08-10 ENCOUNTER — Other Ambulatory Visit: Payer: Self-pay

## 2022-08-10 MED ORDER — ROSUVASTATIN CALCIUM 20 MG PO TABS: 20.0000 mg | ORAL_TABLET | Freq: Every day | ORAL | 3 refills | Status: DC

## 2022-08-29 ENCOUNTER — Ambulatory Visit (HOSPITAL_COMMUNITY)
Admission: RE | Admit: 2022-08-29 | Discharge: 2022-08-29 | Disposition: A | Discharge status: Home / Self Care | Payer: Medicare HMO | Source: Ambulatory Visit | Attending: Cardiology | Admitting: Cardiology

## 2022-08-29 DIAGNOSIS — R0789 Other chest pain: Secondary | ICD-10-CM | POA: Insufficient documentation

## 2022-08-29 DIAGNOSIS — I2510 Atherosclerotic heart disease of native coronary artery without angina pectoris: Secondary | ICD-10-CM | POA: Diagnosis not present

## 2022-08-29 LAB — ECHOCARDIOGRAM COMPLETE
Area-P 1/2: 3.77 cm2
S' Lateral: 2.1 cm

## 2022-08-29 NOTE — Progress Notes (Signed)
*  PRELIMINARY RESULTS* Echocardiogram 2D Echocardiogram has been performed.  Stacey Drain 08/29/2022, 9:59 AM

## 2022-10-23 DIAGNOSIS — J069 Acute upper respiratory infection, unspecified: Secondary | ICD-10-CM | POA: Diagnosis not present

## 2022-10-23 DIAGNOSIS — I700 Atherosclerosis of aorta: Secondary | ICD-10-CM | POA: Diagnosis not present

## 2022-10-23 DIAGNOSIS — D696 Thrombocytopenia, unspecified: Secondary | ICD-10-CM | POA: Diagnosis not present

## 2022-10-23 DIAGNOSIS — F339 Major depressive disorder, recurrent, unspecified: Secondary | ICD-10-CM | POA: Diagnosis not present

## 2022-12-07 DIAGNOSIS — G4733 Obstructive sleep apnea (adult) (pediatric): Secondary | ICD-10-CM | POA: Diagnosis not present

## 2022-12-07 DIAGNOSIS — R0683 Snoring: Secondary | ICD-10-CM | POA: Diagnosis not present

## 2022-12-07 DIAGNOSIS — R5383 Other fatigue: Secondary | ICD-10-CM | POA: Diagnosis not present

## 2022-12-10 IMAGING — CT CT CHEST-ABD-PELV W/ CM
2 of 5 series · 13 of 36 positions shown, 15 images · IV contrast (Omni 300)
Comparison: None.

CLINICAL DATA: Fall

EXAM:
CT CHEST, ABDOMEN, AND PELVIS WITH CONTRAST
TECHNIQUE: Multidetector CT imaging of the chest, abdomen and pelvis was
performed following the standard protocol during bolus
administration of intravenous contrast.
CONTRAST:  100mL OMNIPAQUE IOHEXOL 300 MG/ML  SOLN

[Series 3: cap with 5mm (person_name) (person_name) · axial · 0.98mm/px · z∈[-795,-240]mm · 10 of 137 slices shown, 12 images]
[im 13/137  mediastinal]
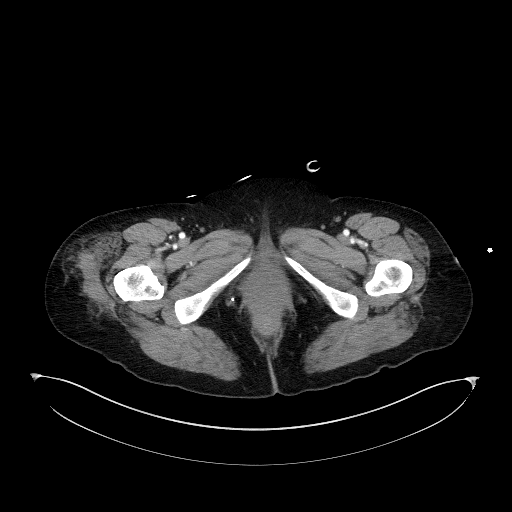
[im 13/137  bone]
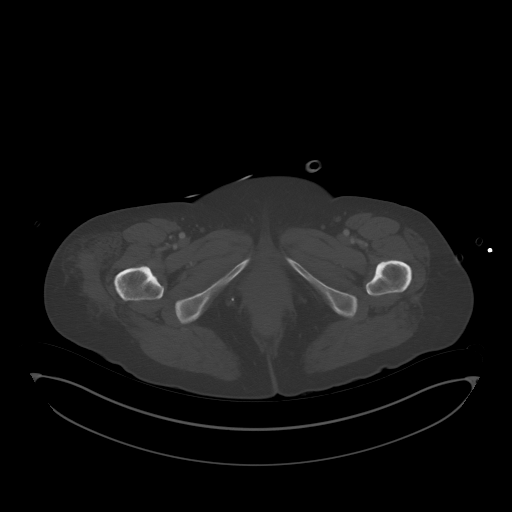
[im 25/137  mediastinal]
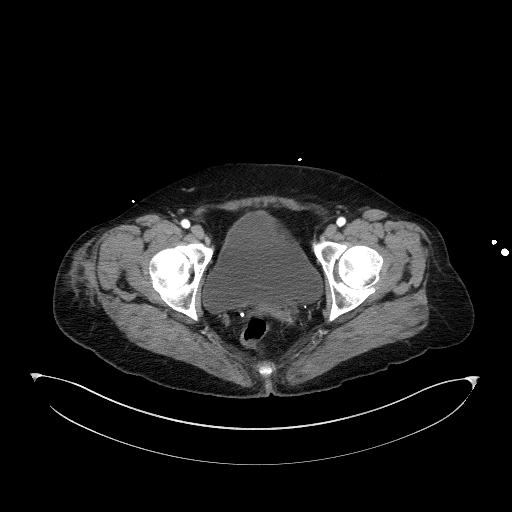
[im 38/137  mediastinal]
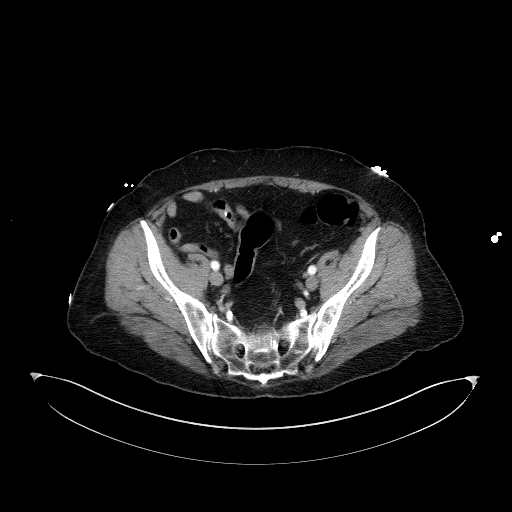
[im 50/137  mediastinal]
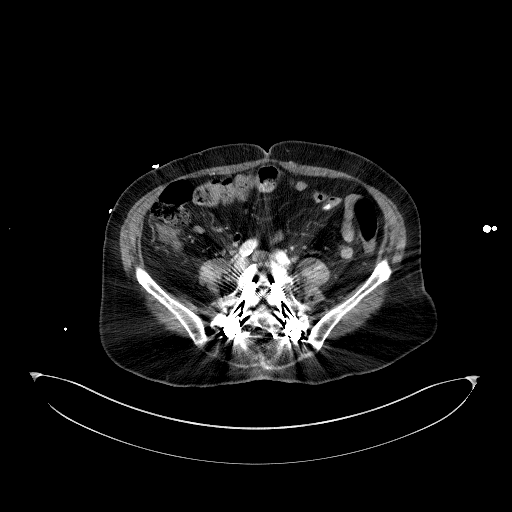
[im 62/137  mediastinal]
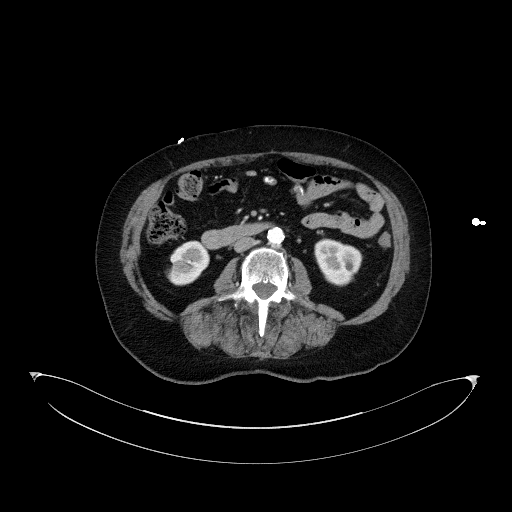
[im 75/137  mediastinal]
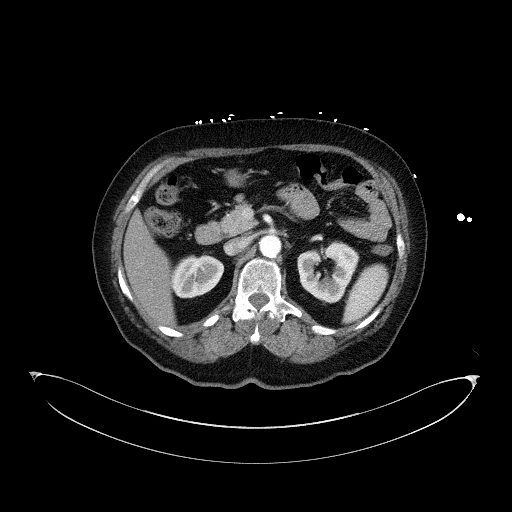
[im 87/137  mediastinal]
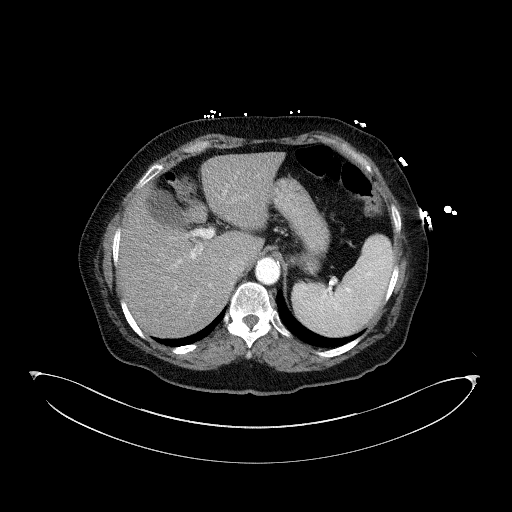
[im 99/137  mediastinal]
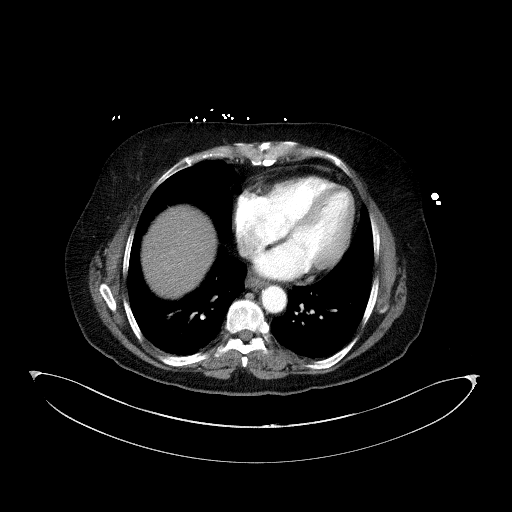
[im 112/137  mediastinal]
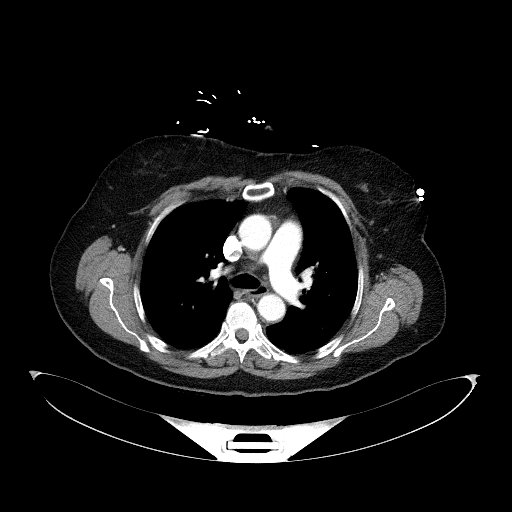
[im 112/137  bone]
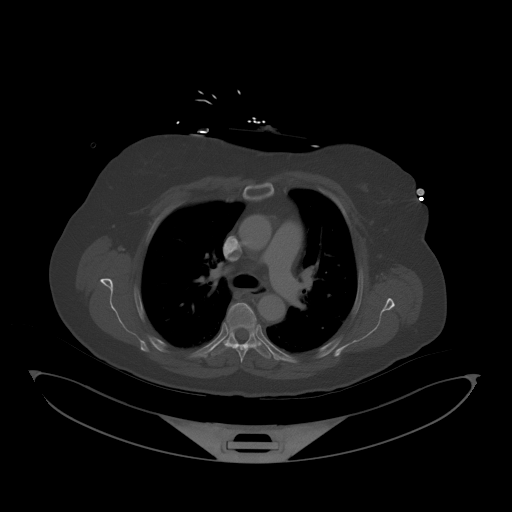
[im 124/137  mediastinal]
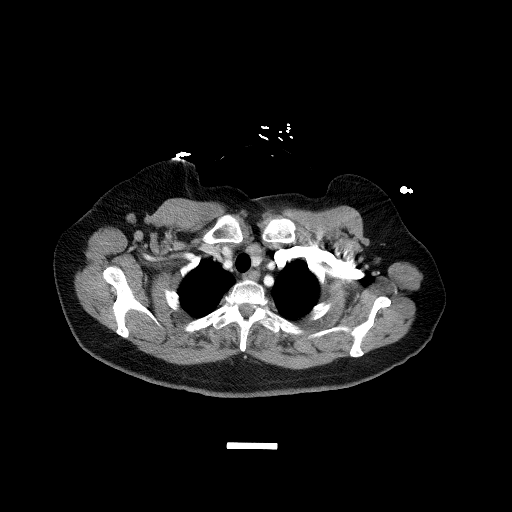

[Series 6: cap with 3mm st cor · coronal · 0.87mm/px · 3 of 159 slices shown]
[im 32/159  mediastinal]
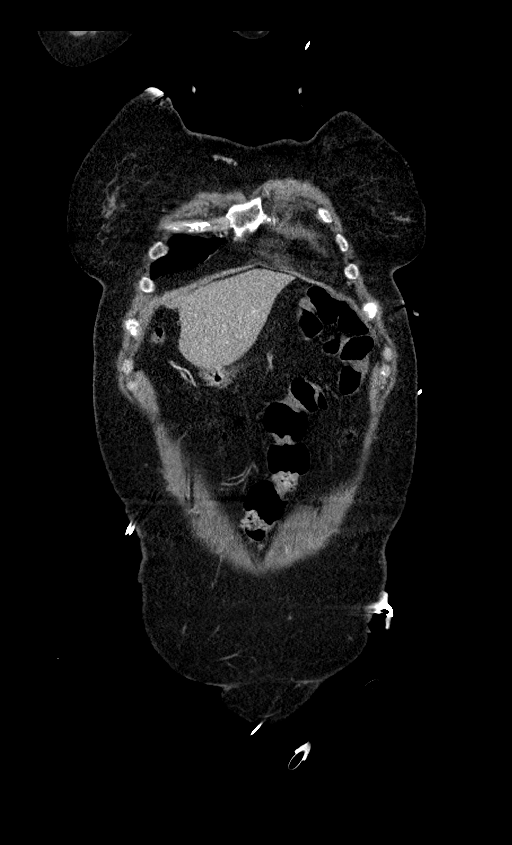
[im 64/159  mediastinal]
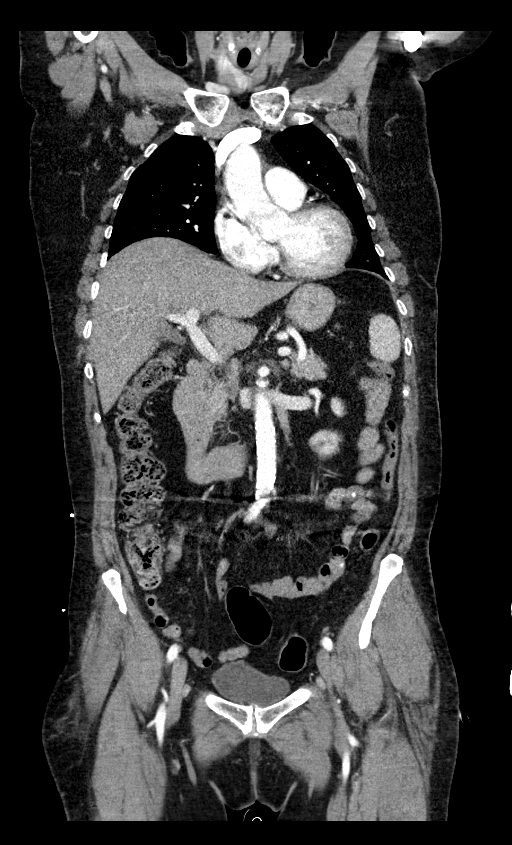
[im 95/159  mediastinal]
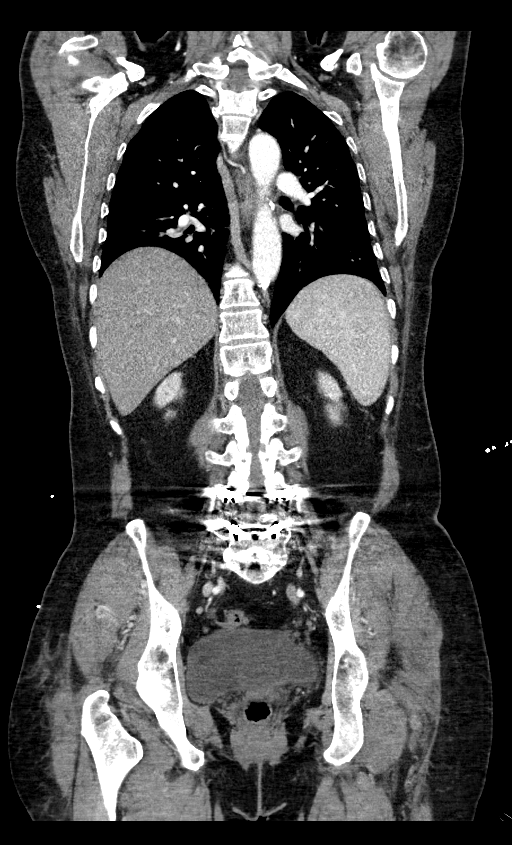

[13 of 36 positions shown; findings below may reference images not displayed]

FINDINGS: CT CHEST FINDINGS

Cardiovascular: LEFT-sided coronary artery atherosclerotic
calcifications. Moderate atherosclerotic calcifications throughout
the aorta. No evidence of acute aortic injury with mildly limit
evaluation of the aortic root secondary to cardiac motion. Normal
heart size. No pericardial effusion.

Mediastinum/Nodes: Subcentimeter thyroid nodules. No suspicious
lymphadenopathy.

Lungs/Pleura: No pleural effusion or pneumothorax. Scattered
atelectasis. There is a 3 mm subpleural pulmonary nodule the RIGHT
middle lobe (series 5, image 90). There is a 5 mm pulmonary nodule
the RIGHT lower lobe (series 5, image 92).

Musculoskeletal: Incomplete fusion of the RIGHT posterior arch of
the inferior cervical spine, better assessed on same day cervical
spine CT. No acute fracture of the thoracic spine.

CT ABDOMEN PELVIS FINDINGS

Hepatobiliary: No hepatic injury or perihepatic hematoma.
Gallbladder is unremarkable hepatic steatosis. Portal vein is
patent.

Pancreas: Unremarkable. No pancreatic ductal dilatation or
surrounding inflammatory changes.

Spleen: No splenic injury or perisplenic hematoma.

Adrenals/Urinary Tract: No adrenal hemorrhage or renal injury
identified. Bladder is unremarkable. Cortical scarring of the
inferior pole of the RIGHT kidney. Scattered subcentimeter bilateral
nephrolithiasis. Kidneys enhance symmetrically. Subcentimeter
hypodense lesions are too small to accurately characterize. No
hydronephrosis. Bladder is unremarkable.

Stomach/Bowel: Stomach is within normal limits. Appendix appears
normal. No evidence of bowel wall thickening, distention, or
inflammatory changes.

Vascular/Lymphatic: Atherosclerotic calcifications of the aorta. No
suspicious lymphadenopathy.

Reproductive: Status post hysterectomy.  No adnexal masses.

Other: Subcutaneous hematoma and fat stranding along the RIGHT
proximal thigh. No evidence of active contrast extravasation.

Musculoskeletal: Status post posterior fixation of the lower lumbar
spine. No acute fracture of the lumbar spine.
IMPRESSION: 1. No evidence of acute traumatic injury within the chest, abdomen,
or pelvis.
2. Subcutaneous hematoma and fat stranding along the RIGHT proximal
thigh. No evidence of active contrast extravasation.
3. Small pulmonary nodules measuring up to 5 mm in the RIGHT lower
lobe. No follow-up needed if patient is low-risk (and has no known
or suspected primary neoplasm). Non-contrast chest CT can be
considered in 12 months if patient is high-risk. This recommendation
follows the consensus statement: Guidelines for Management of
Incidental Pulmonary Nodules Detected on CT Images: From the

Aortic Atherosclerosis (L3DKH-112.2).

## 2023-03-06 DIAGNOSIS — U071 COVID-19: Secondary | ICD-10-CM | POA: Diagnosis not present

## 2023-03-06 DIAGNOSIS — R5383 Other fatigue: Secondary | ICD-10-CM | POA: Diagnosis not present

## 2023-05-11 DIAGNOSIS — F339 Major depressive disorder, recurrent, unspecified: Secondary | ICD-10-CM | POA: Diagnosis not present

## 2023-05-11 DIAGNOSIS — I700 Atherosclerosis of aorta: Secondary | ICD-10-CM | POA: Diagnosis not present

## 2023-05-11 DIAGNOSIS — Z1339 Encounter for screening examination for other mental health and behavioral disorders: Secondary | ICD-10-CM | POA: Diagnosis not present

## 2023-05-11 DIAGNOSIS — Z7189 Other specified counseling: Secondary | ICD-10-CM | POA: Diagnosis not present

## 2023-05-11 DIAGNOSIS — Z1331 Encounter for screening for depression: Secondary | ICD-10-CM | POA: Diagnosis not present

## 2023-05-11 DIAGNOSIS — Z0000 Encounter for general adult medical examination without abnormal findings: Secondary | ICD-10-CM | POA: Diagnosis not present

## 2023-05-11 DIAGNOSIS — Z299 Encounter for prophylactic measures, unspecified: Secondary | ICD-10-CM | POA: Diagnosis not present

## 2023-05-11 DIAGNOSIS — R8290 Unspecified abnormal findings in urine: Secondary | ICD-10-CM | POA: Diagnosis not present

## 2023-05-25 DIAGNOSIS — Z1231 Encounter for screening mammogram for malignant neoplasm of breast: Secondary | ICD-10-CM | POA: Diagnosis not present

## 2023-06-01 DIAGNOSIS — E2839 Other primary ovarian failure: Secondary | ICD-10-CM | POA: Diagnosis not present

## 2023-07-13 DIAGNOSIS — Z0000 Encounter for general adult medical examination without abnormal findings: Secondary | ICD-10-CM | POA: Diagnosis not present

## 2023-07-13 DIAGNOSIS — I700 Atherosclerosis of aorta: Secondary | ICD-10-CM | POA: Diagnosis not present

## 2023-07-13 DIAGNOSIS — Z299 Encounter for prophylactic measures, unspecified: Secondary | ICD-10-CM | POA: Diagnosis not present

## 2023-07-13 DIAGNOSIS — R200 Anesthesia of skin: Secondary | ICD-10-CM | POA: Diagnosis not present

## 2023-07-13 DIAGNOSIS — Z79899 Other long term (current) drug therapy: Secondary | ICD-10-CM | POA: Diagnosis not present

## 2023-07-13 DIAGNOSIS — R5383 Other fatigue: Secondary | ICD-10-CM | POA: Diagnosis not present

## 2023-07-13 DIAGNOSIS — E7800 Pure hypercholesterolemia, unspecified: Secondary | ICD-10-CM | POA: Diagnosis not present

## 2023-07-13 DIAGNOSIS — E559 Vitamin D deficiency, unspecified: Secondary | ICD-10-CM | POA: Diagnosis not present

## 2023-08-28 DIAGNOSIS — I83899 Varicose veins of unspecified lower extremities with other complications: Secondary | ICD-10-CM | POA: Diagnosis not present

## 2023-08-28 DIAGNOSIS — I700 Atherosclerosis of aorta: Secondary | ICD-10-CM | POA: Diagnosis not present

## 2023-08-28 DIAGNOSIS — S8010XA Contusion of unspecified lower leg, initial encounter: Secondary | ICD-10-CM | POA: Diagnosis not present

## 2023-08-28 DIAGNOSIS — M545 Low back pain, unspecified: Secondary | ICD-10-CM | POA: Diagnosis not present

## 2023-10-01 ENCOUNTER — Other Ambulatory Visit: Payer: Self-pay | Admitting: Cardiology

## 2023-11-08 DIAGNOSIS — R3589 Other polyuria: Secondary | ICD-10-CM | POA: Diagnosis not present

## 2023-11-08 DIAGNOSIS — R7309 Other abnormal glucose: Secondary | ICD-10-CM | POA: Diagnosis not present

## 2023-11-08 DIAGNOSIS — Z299 Encounter for prophylactic measures, unspecified: Secondary | ICD-10-CM | POA: Diagnosis not present

## 2023-11-08 DIAGNOSIS — R350 Frequency of micturition: Secondary | ICD-10-CM | POA: Diagnosis not present

## 2023-11-08 DIAGNOSIS — F322 Major depressive disorder, single episode, severe without psychotic features: Secondary | ICD-10-CM | POA: Diagnosis not present

## 2023-11-15 DIAGNOSIS — M2550 Pain in unspecified joint: Secondary | ICD-10-CM | POA: Diagnosis not present

## 2023-11-15 DIAGNOSIS — R52 Pain, unspecified: Secondary | ICD-10-CM | POA: Diagnosis not present

## 2023-11-15 DIAGNOSIS — Z299 Encounter for prophylactic measures, unspecified: Secondary | ICD-10-CM | POA: Diagnosis not present

## 2023-11-15 DIAGNOSIS — R3589 Other polyuria: Secondary | ICD-10-CM | POA: Diagnosis not present

## 2024-01-09 ENCOUNTER — Other Ambulatory Visit: Payer: Self-pay | Admitting: Cardiology

## 2024-01-11 ENCOUNTER — Telehealth: Payer: Self-pay | Admitting: Cardiology

## 2024-01-11 MED ORDER — ROSUVASTATIN CALCIUM 20 MG PO TABS: 20.0000 mg | ORAL_TABLET | Freq: Every day | ORAL | 0 refills | Status: DC

## 2024-01-11 NOTE — Telephone Encounter (Signed)
*  STAT* If patient is at the pharmacy, call can be transferred to refill team.   1. Which medications need to be refilled? (please list name of each medication and dose if known)   rosuvastatin  (CRESTOR ) 20 MG tablet   2. Would you like to learn more about the convenience, safety, & potential cost savings by using the [facility]?   3. Are you open to using the [facility] (Type [facility]. ).  4. Which pharmacy/location (including street and city if local pharmacy) is medication to be sent to?  [facility] - [place], [place] - [address]    5. Do they need a 30 day or 90 day supply?   90 day  Patient stated she will be completely out of this medication on 11/9.  Patient has appointment scheduled with Dr. Debera on 12/30.

## 2024-02-07 ENCOUNTER — Other Ambulatory Visit: Payer: Self-pay | Admitting: Cardiology

## 2024-03-04 ENCOUNTER — Ambulatory Visit: Payer: Self-pay | Admitting: Cardiology

## 2024-04-07 ENCOUNTER — Other Ambulatory Visit: Payer: Self-pay | Admitting: Cardiology

## 2024-04-09 ENCOUNTER — Other Ambulatory Visit: Payer: Self-pay | Admitting: Cardiology

## 2024-04-10 NOTE — Telephone Encounter (Signed)
 Lipids Completed on 07/13/23

## 2024-04-11 ENCOUNTER — Encounter: Payer: Self-pay | Admitting: Cardiology

## 2024-04-11 ENCOUNTER — Ambulatory Visit: Payer: Self-pay | Admitting: Cardiology

## 2024-04-11 VITALS — BP 98/68 | HR 81 | Ht 64.5 in | Wt 139.8 lb

## 2024-04-11 DIAGNOSIS — R002 Palpitations: Secondary | ICD-10-CM

## 2024-04-11 DIAGNOSIS — E782 Mixed hyperlipidemia: Secondary | ICD-10-CM

## 2024-04-11 DIAGNOSIS — I2510 Atherosclerotic heart disease of native coronary artery without angina pectoris: Secondary | ICD-10-CM

## 2024-04-11 NOTE — Progress Notes (Signed)
"  ° ° °  Cardiology Office Note  Date: 04/11/2024   ID: FLOWER FRANKO, DOB 10-Sep-1955, MRN [id number]  History of Present Illness: [place]  LELON Rocha is a 69 y.o. female last seen in May 2024.  She is here for a routine follow-up visit.  States that she has been doing well, no exertional chest pain, no lightheadedness or palpitations, no syncope.  She does some walking for exercise.  Follows with Dr. Rosamond for primary care as before.  We went over her medications.  She has been tolerating Crestor  20 mg daily (switched from Zocor  previously).  LDL has come down to 83 as of May 2025.  I reviewed her ECG today which shows normal sinus rhythm.  We discussed the results of her cardiac testing from 2024.  Physical Exam: VS:  BP 98/68   Pulse 81   Ht 5' 4.5 (1.638 m)   Wt 139 lb 12.8 oz (63.4 kg)   SpO2 97%   BMI 23.63 kg/m , BMI Body mass index is 23.63 kg/m.  Wt Readings from Last 3 Encounters:  04/11/24 139 lb 12.8 oz (63.4 kg)  07/18/22 170 lb 6.4 oz (77.3 kg)  02/21/20 150 lb (68 kg)    General: Patient appears comfortable at rest. HEENT: Conjunctiva and lids normal. Neck: Supple, no elevated JVP or carotid bruits. Lungs: Clear to auscultation, nonlabored breathing at rest. Cardiac: Regular rate and rhythm, no S3 or significant systolic murmur.  ECG:  An ECG dated 07/18/2022 was personally reviewed today and demonstrated:  Sinus rhythm.  Labwork:  May 2025: Hemoglobin 14, platelets 167, cholesterol 152, triglycerides 143, HDL 44, LDL 83 September 2025: BUN 8, creatinine 0.7, GFR 96, potassium 4.2, AST 21, ALT 15, TSH 1.02  Other Studies Reviewed Today:  No interval cardiac testing for review today.  Assessment and Plan:  1.  Coronary artery calcification evident by routine chest CT done at [facility] in January 2023.  Lexiscan  Myoview  in May 2024 was negative for ischemia, low risk with LVEF 71%.  Echocardiogram in June 2024 showed similar LVEF with mild diastolic  dysfunction, normal estimated RVSP, no major valvular abnormalities.  She is asymptomatic at this point, ECG reviewed and normal today.  Would plan to continue observation and risk factor reduction with follow-up by PCP.   2.  Mixed hyperlipidemia.  LDL 83 in May 2025.  Continue Crestor  20 mg daily and follow-up with PCP for refills and management.  3.  History of palpitations and intermittent lightheadedness.  Cardiac monitor in May 2024 demonstrated rare PACs and PVCs with a single brief episode of PSVT.  No active symptoms at this time.  Disposition:  Follow up prn  Signed, Jayson JUDITHANN Sierras, M.D., F.A.C.C[facility] "

## 2024-04-11 NOTE — Telephone Encounter (Signed)
 Per Dr. Debera at office visit today: 2.  Mixed hyperlipidemia.  LDL 83 in May 2025.  Continue Crestor  20 mg daily and follow-up with PCP for refills and management.

## 2024-04-11 NOTE — Patient Instructions (Signed)
 Medication Instructions:  Your physician recommends that you continue on your current medications as directed. Please refer to the Current Medication list given to you today.   Labwork: None today  Testing/Procedures: None today  Follow-Up: As needed  Any Other Special Instructions Will Be Listed Below (If Applicable).  If you need a refill on your cardiac medications before your next appointment, please call your pharmacy.
